# Patient Record
Sex: Female | Born: 1990 | Race: White | Hispanic: No | Marital: Single | Smoking: Never smoker
Health system: Southern US, Community
[De-identification: ages and names within clinical notes are randomized; demographics above are authoritative.]

## PROBLEM LIST (undated history)

## (undated) DIAGNOSIS — G43909 Migraine, unspecified, not intractable, without status migrainosus: Secondary | ICD-10-CM

## (undated) DIAGNOSIS — R569 Unspecified convulsions: Secondary | ICD-10-CM

## (undated) DIAGNOSIS — H543 Unqualified visual loss, both eyes: Secondary | ICD-10-CM

## (undated) DIAGNOSIS — J45909 Unspecified asthma, uncomplicated: Secondary | ICD-10-CM

## (undated) HISTORY — PX: EYE SURGERY: SHX253

---
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
## 2012-01-04 ENCOUNTER — Encounter: Payer: Medicaid Other | Admitting: Rehabilitation

## 2012-01-06 ENCOUNTER — Encounter: Payer: Medicaid Other | Admitting: Rehabilitation

## 2012-01-07 ENCOUNTER — Ambulatory Visit: Payer: Medicaid Other | Attending: Orthopedic Surgery | Admitting: Rehabilitation

## 2012-01-07 DIAGNOSIS — M545 Low back pain, unspecified: Secondary | ICD-10-CM | POA: Insufficient documentation

## 2012-01-07 DIAGNOSIS — M2569 Stiffness of other specified joint, not elsewhere classified: Secondary | ICD-10-CM | POA: Insufficient documentation

## 2012-01-07 DIAGNOSIS — IMO0001 Reserved for inherently not codable concepts without codable children: Secondary | ICD-10-CM | POA: Insufficient documentation

## 2012-01-10 ENCOUNTER — Ambulatory Visit: Payer: Medicaid Other | Admitting: Physical Therapy

## 2012-01-12 ENCOUNTER — Ambulatory Visit: Payer: Medicaid Other | Admitting: Rehabilitation

## 2012-01-17 ENCOUNTER — Ambulatory Visit: Payer: Medicaid Other | Admitting: Rehabilitation

## 2012-01-19 ENCOUNTER — Ambulatory Visit: Payer: Medicaid Other | Admitting: Physical Therapy

## 2012-01-24 ENCOUNTER — Encounter: Payer: Medicaid Other | Admitting: Physical Therapy

## 2012-01-26 ENCOUNTER — Ambulatory Visit: Payer: Medicaid Other | Admitting: Physical Therapy

## 2012-01-28 ENCOUNTER — Encounter: Payer: Medicaid Other | Admitting: Physical Therapy

## 2012-01-31 ENCOUNTER — Encounter: Payer: Medicaid Other | Admitting: Physical Therapy

## 2012-02-02 ENCOUNTER — Ambulatory Visit: Payer: Medicaid Other | Attending: Orthopedic Surgery | Admitting: Physical Therapy

## 2012-02-02 DIAGNOSIS — M2569 Stiffness of other specified joint, not elsewhere classified: Secondary | ICD-10-CM | POA: Insufficient documentation

## 2012-02-02 DIAGNOSIS — M545 Low back pain, unspecified: Secondary | ICD-10-CM | POA: Insufficient documentation

## 2012-02-02 DIAGNOSIS — IMO0001 Reserved for inherently not codable concepts without codable children: Secondary | ICD-10-CM | POA: Insufficient documentation

## 2012-02-07 ENCOUNTER — Ambulatory Visit: Payer: Medicaid Other | Admitting: Physical Therapy

## 2012-02-09 ENCOUNTER — Ambulatory Visit: Payer: Medicaid Other | Admitting: Physical Therapy

## 2012-02-14 ENCOUNTER — Encounter: Payer: Medicaid Other | Admitting: Physical Therapy

## 2012-02-16 ENCOUNTER — Encounter: Payer: Medicaid Other | Admitting: Physical Therapy

## 2014-04-24 ENCOUNTER — Other Ambulatory Visit (HOSPITAL_COMMUNITY)
Admission: RE | Admit: 2014-04-24 | Discharge: 2014-04-24 | Disposition: A | Discharge status: Home / Self Care | Payer: Medicaid Other | Source: Ambulatory Visit | Attending: Family Medicine | Admitting: Family Medicine

## 2014-04-24 ENCOUNTER — Other Ambulatory Visit: Payer: Self-pay | Admitting: Family Medicine

## 2014-04-24 DIAGNOSIS — Z124 Encounter for screening for malignant neoplasm of cervix: Secondary | ICD-10-CM | POA: Diagnosis present

## 2014-04-24 DIAGNOSIS — Z1151 Encounter for screening for human papillomavirus (HPV): Secondary | ICD-10-CM | POA: Diagnosis present

## 2014-04-26 LAB — CYTOLOGY - PAP

## 2015-08-27 ENCOUNTER — Other Ambulatory Visit: Payer: Self-pay | Admitting: Orthopedic Surgery

## 2015-08-27 DIAGNOSIS — M25571 Pain in right ankle and joints of right foot: Secondary | ICD-10-CM

## 2015-09-03 ENCOUNTER — Ambulatory Visit
Admission: RE | Admit: 2015-09-03 | Discharge: 2015-09-03 | Disposition: A | Discharge status: Home / Self Care | Payer: Medicaid Other | Source: Ambulatory Visit | Attending: Orthopedic Surgery | Admitting: Orthopedic Surgery

## 2015-09-03 DIAGNOSIS — M25571 Pain in right ankle and joints of right foot: Secondary | ICD-10-CM

## 2015-09-07 ENCOUNTER — Other Ambulatory Visit: Payer: Medicaid Other

## 2015-09-30 ENCOUNTER — Ambulatory Visit: Payer: Medicaid Other | Attending: Orthopedic Surgery | Admitting: Physical Therapy

## 2015-09-30 DIAGNOSIS — M25571 Pain in right ankle and joints of right foot: Secondary | ICD-10-CM | POA: Diagnosis present

## 2015-09-30 NOTE — Patient Instructions (Signed)
  Gastroc / Heel Cord Stretch - Seated With Towel   Sit on floor, towel around ball of foot. Gently pull foot in toward body, stretching heel cord and calf. Hold for __20_ seconds. Repeat on involved leg. Repeat _3__ times. Do 7___ times per day.  Copyright  VHI. All rights reserved.   Ankle Inversion / Eversion, Sitting   Sit and grasp bottom of one foot. Bend ankle by moving foot inward and outward. Hold each position ___5 seconds. Repeat ___7 times per session. Do _7__ sessions per day.  Copyright  VHI. All rights reserved.   Ankle Sideward Movement (Inversion / Eversion)   Sitting or lying with feet on floor, keep knee still and rock foot onto outer edge. Return to resting position. Now rock foot onto inner edge. Repeat with other foot, then with both feet. Repeat __7__ times. Do __7__ sessions per day.  http://[email or web address].exer.us/405   Copyright  VHI. All rights reserved.     Copyright  VHI. All rights reserved.  HIP: Flexion / KNEE: Extension, Straight Leg Raise   Raise leg, keeping knee straight. Perform slowly. __7_ reps per set, _1 sets per day, __7_ days per week   Copyright  VHI. All rights reserved.  Heel Slide   Bend knee and pull heel toward buttocks. Hold ___5_ seconds. Return. Repeat with other knee. Repeat __7__ times. Do _7___ sessions per day.  http://[email or web address].exer.us/372   Copyright  VHI. All rights reserved.     Raise leg until knee is straight. ___7 reps per set, ___7 sets per day, __7_ days per week  Copyright  VHI. All rights reserved.  Short Arc Arrow ElectronicsQuad   Place a large can or rolled towel under leg. Straighten knee and leg. Hold _5___ seconds. Repeat with other leg. Repeat __7__ times. Do ____7 sessions per day.  http://[email or web address].exer.us/365   Copyright  VHI. All rights reserved.  Quad Set   Slowly tighten muscles on thigh of straight leg while counting out loud to __5__. Repeat with other leg. Repeat __10__ times. Do _7___ sessions per  day.  http://[email or web address].exer.us/361   Copyright  VHI. All rights reserved.

## 2015-10-01 NOTE — Therapy (Signed)
[facility] [address] [place], [place], [address] Phone: [phone]   Fax:  [phone]  Physical Therapy Evaluation/Medicaid 1x visit  Patient Details  Name: Suzanne Fletcher MRN: [id number] Date of Birth: 02-24-1991 Referring Provider: Dr. August Saucer  Encounter Date: 09/30/2015      PT End of Session - 09/30/15 1500    Visit Number 1   Authorization Type Medicaid 1x visit   PT Start Time 0933   PT Stop Time 1017   PT Time Calculation (min) 44 min   Equipment Utilized During Treatment Other (comment)  RW   Activity Tolerance Patient limited by pain      No past medical history on file.  No past surgical history on file.  There were no vitals filed for this visit.  Visit Diagnosis:  Pain in joint, ankle and foot, right - Plan: PT plan of care cert/re-cert      Subjective Assessment - 09/30/15 0937    Subjective In October fell on a handicap ramp.  Inititally able to put weight on it for 2 weeks but as the month went on, had increasing difficulty weightbearing.  Now in walking boot since end of October.    Initially medial but now lateral ankle pain as well.  Hard to have her leg straight in bed.  States the doctor thought it was a high ankle sprain intially.  Bruising still present per mother.  Patient states she has been hopping around the house.  Anterior knee pain as well.     Patient is accompained by: Family member   Pertinent History visual impairment, has a Administrator, Civil Service   Limitations Walking;House hold activities;Standing   Diagnostic tests X-ray and MRI normal    Patient Stated Goals Be able to walk;  wanted to go to a convention in January.     Currently in Pain? Yes   Pain Score 8    Pain Location Ankle   Pain Orientation Right   Pain Descriptors / Indicators Aching   Pain Type Chronic pain   Pain Onset More than a month ago   Pain Frequency Constant   Aggravating Factors  walking, night time   Pain  Relieving Factors cold            [facility] PT Assessment - 09/30/15 0943    Assessment   Medical Diagnosis Right soft tissue ankle injury   Referring Provider Dr. August Saucer   Onset Date/Surgical Date 07/10/15   Hand Dominance Right   Next MD Visit as needed   Prior Therapy back, left foot   Precautions   Precautions None   Restrictions   Weight Bearing Restrictions No   Balance Screen   Has the patient fallen in the past 6 months Yes   How many times? 3   Has the patient had a decrease in activity level because of a fear of falling?  Yes   Is the patient reluctant to leave their home because of a fear of falling?  No   Home Tourist information centre manager residence   Research officer, trade union;Other relatives   Type of Home House   Home Access Stairs to enter   Entrance Stairs-Number of Steps 2   Entrance Stairs-Rails None   Home Layout One level   Prior Function   Level of Independence Needs assistance with homemaking;Needs assistance with gait   Vocation Unemployed   Leisure going out   Observation/Other Assessments   Focus on Therapeutic Outcomes (FOTO)  not done secondary to 1x Medicaid   Posture/Postural Control   Posture/Postural Control --  Patient lies with LE in ER position, knee flexed   Posture Comments LE has a darker skin coloration especially hanging down in dependent position   AROM   AROM Assessment Site Knee;Ankle  Poor toe movement   Right/Left Knee Right;Left   Right Knee Extension 16   Right Knee Flexion 70   Left Knee Extension 0   Left Knee Flexion 140   Right/Left Ankle Right;Left   Right Ankle Dorsiflexion -15   Right Ankle Plantar Flexion 30   Right Ankle Inversion 0   Right Ankle Eversion 0   Left Ankle Dorsiflexion --  WFLs   Strength   Strength Assessment Site Hip;Knee;Ankle   Right/Left Hip Right   Right Hip ABduction 4/5   Right/Left Knee Right   Right Knee Flexion 2/5   Right Knee Extension 2/5   Right/Left Ankle Right    Right Ankle Dorsiflexion 2-/5   Right Ankle Plantar Flexion 2-/5   Right Ankle Inversion 1/5   Right Ankle Eversion 1/5   Palpation   Palpation comment Sensitive to light touch; right colder than left                           PT Education - 09/30/15 1500    Education provided Yes   Education Details ankle and knee ROM, contrast baths, use of RW or assistive device   Person(s) Educated Patient;Parent(s)   Methods Explanation;Demonstration;Handout;Tactile cues;Verbal cues   Comprehension Verbalized understanding             PT Long Term Goals - 10/01/15 0824    PT LONG TERM GOAL #1   Title The patient and mother will be instructed in basic self management strategies for safer ambulation, ROM and desensitization   Time 1   Period Days   Status Achieved               Plan - 09/30/15 1502    Clinical Impression Statement The patient is a 24 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  Inititally able to put weight on it for 2 weeks but as the month went on, had increasing difficulty weightbearing.     Patient states she has been worsening over time and now has anterior knee pain in addition to pain and extreme sensitivity in her entire foot.   She presents today with her service dog, her LE in a walking boot and hopping slowly and painfully.  Her mother is assisting as well.  The patient has sensitivity to touch of right foot and ankle.  Darker red/purple skin discoloration hanging down.  Cold skin temp compared to left.  In supine, she holds her LE in a bent knee, externally rotated position.  She has trace movement in toes, very painful and limited ankle ROM, decreased knee AROM.  She has extreme pain straightening her knee.  It is also of great concern  that she has fallen twice since her iniitial injury in October.  I recommend a RW for safety and a more normal gait pattern.  She was instructed in gait with a RW in the clinic  and she was able to ambulate at a better speed, decreased pain and energy expenditure but she is unable to use her service dog  while using a walker.  We also discussed a single crutch or  cane but the optimal side for benefit would be on the left side and this is the side her service dog has been trained to be on.  Myrtice would greatly benefit from PT to address these numerous deficits however due to changes in the [facility] Policy for rehab as of March 04, 2013, she does not have a qualifying diagnosis that is covered.  The patient is unable to pay out of pocket expenses at this time, therefore she will not be seen for treatment.  We discussed techniques for desensitization, ROM exercises, positioning to encourage knee extension and gait sequence to avoid strain on her knee, hip and back.  [facility] using [facility] handic[facility] ramp into the pool and walking in the water with her mother's assist.  Recommend she get a RW to decrease risk of falls and further injury.     Recommended Other Services Rolling walker for home use   Consulted and Agree with Plan of Care Patient;Family member/caregiver       Due to changes in the [facility] Policy for Rehab as of June 1st , 2014-- this patient does not have a qualifying diagnosis that is covered.  The patient is unable topay out of pocket expenses at this time, there fore will not be seen for treatment.     Problem List There are no active problems to display for this patient.   Vivien Presto 10/01/2015, 8:30 AM  [facility] [address] [place], [place], [address] Phone: [phone]   Fax:  [phone]  Name: Suzanne Fletcher MRN: [id number] Date of Birth: Aug 27, 1991  Lavinia Sharps, PT 10/01/2015 8:31 AM Phone: [phone] Fax: [phone]

## 2015-10-17 ENCOUNTER — Emergency Department (HOSPITAL_COMMUNITY)
Admission: EM | Admit: 2015-10-17 | Discharge: 2015-10-18 | Disposition: A | Discharge status: Home / Self Care | Payer: Medicaid Other | Attending: Emergency Medicine | Admitting: Emergency Medicine

## 2015-10-17 ENCOUNTER — Encounter (HOSPITAL_COMMUNITY): Payer: Self-pay | Admitting: *Deleted

## 2015-10-17 ENCOUNTER — Emergency Department (HOSPITAL_COMMUNITY): Payer: Medicaid Other

## 2015-10-17 DIAGNOSIS — Z880 Allergy status to penicillin: Secondary | ICD-10-CM | POA: Insufficient documentation

## 2015-10-17 DIAGNOSIS — R200 Anesthesia of skin: Secondary | ICD-10-CM | POA: Diagnosis not present

## 2015-10-17 DIAGNOSIS — R52 Pain, unspecified: Secondary | ICD-10-CM

## 2015-10-17 DIAGNOSIS — M25561 Pain in right knee: Secondary | ICD-10-CM | POA: Diagnosis not present

## 2015-10-17 DIAGNOSIS — R202 Paresthesia of skin: Secondary | ICD-10-CM | POA: Insufficient documentation

## 2015-10-17 DIAGNOSIS — Z87828 Personal history of other (healed) physical injury and trauma: Secondary | ICD-10-CM | POA: Diagnosis not present

## 2015-10-17 MED ORDER — IBUPROFEN 400 MG PO TABS
800.0000 mg | ORAL_TABLET | Freq: Once | ORAL | Status: AC
  Administered 2015-10-17: 800 mg via ORAL
  Filled 2015-10-17: qty 2

## 2015-10-17 NOTE — ED Notes (Signed)
The pt has a locked rt knee for 2 days.  No known injury. Recent rt ankle injury.

## 2015-10-17 NOTE — ED Notes (Signed)
Service dog at bedside

## 2015-10-17 NOTE — ED Notes (Signed)
Pt to mri 

## 2015-10-17 NOTE — ED Provider Notes (Signed)
CSN: [id number]     Arrival date & time 10/17/15  1812 History  By signing my name below, I, Doreatha Martin, attest that this documentation has been prepared under the direction and in the presence of Cheri Fowler, PA-C. Electronically Signed: Doreatha Martin, ED Scribe. 10/17/2015. 7:01 PM.      Chief Complaint  Patient presents with  . Knee Pain   The history is provided by the patient. No language interpreter was used.   HPI Comments: Suzanne Fletcher is a 25 y.o. female otherwise healthy who presents to the Emergency Department complaining of decreased ROM of the right knee onset yesterday with associated mild pain and swelling. Pt reports her knee feels locked in a 90 degree flexed position has been unable to fully extend the knee. She reports a recent right ankle injury after a mechanical fall in 07/2015 and notes she also had some mild knee pain with this injury. She states she has been attending physical therapy for her ankle injury. Pt also reports some intermittent paresthesia and numbness to the leg, but reports this is normal since the ankle injury.No Hx of similar symptoms. Pt states she has tried ice and heat with little to no relief.  She denies fever, abdominal pain, nausea, emesis, additional injuries.    History reviewed. No pertinent past medical history. History reviewed. No pertinent past surgical history. No family history on file. Social History  Substance Use Topics  . Smoking status: Never Smoker   . Smokeless tobacco: None  . Alcohol Use: No   OB History    No data available     Review of Systems A complete 10 system review of systems was obtained and all systems are negative except as noted in the HPI and PMH.    Allergies  Penicillins and Sulfa antibiotics  Home Medications   Prior to Admission medications   Not on File   BP 108/71 mmHg  Pulse 98  Temp(Src) 98.4 F (36.9 C) (Oral)  Resp 18  Ht 5\' 6"  (1.676 m)  Wt 165 lb (74.844 kg)  BMI 26.64 kg/m2   SpO2 99%  LMP 09/28/2015 Physical Exam  Constitutional: She is oriented to person, place, and time. She appears well-developed and well-nourished.  Pt lying in bed with knee flexed at 90 degrees.   HENT:  Head: Normocephalic and atraumatic.  Eyes: Conjunctivae and EOM are normal. Pupils are equal, round, and reactive to light.  Neck: Normal range of motion. Neck supple.  Cardiovascular: Normal rate.   2+ DP pulses bilaterally.   Pulmonary/Chest: Effort normal. No respiratory distress.  Abdominal: She exhibits no distension.  Musculoskeletal:       Right knee: She exhibits decreased range of motion. She exhibits no swelling, no laceration and no erythema.       Legs: Right knee mildly TTP along medial and lateral joint line. No appreciable swelling. Unable to actively or passively fully extend knee.  No focal tightness of the popliteal muscle.  Neurological: She is alert and oriented to person, place, and time.  Strength and sensation intact bilaterally throughout the lower extremities.   Skin: Skin is warm and dry.  Psychiatric: She has a normal mood and affect. Her behavior is normal.  Nursing note and vitals reviewed.   ED Course  Procedures (including critical care time) DIAGNOSTIC STUDIES: Oxygen Saturation is 99% on RA, normal by my interpretation.    COORDINATION OF CARE: 6:54 PM Discussed treatment plan with pt at bedside which includes XR,  pain management and pt agreed to plan.   Imaging Review Dg Knee 1-2 Views Right  10/17/2015  CLINICAL DATA:  Acute onset of right knee pain. Knee locked in flexed position. Initial encounter. EXAM: RIGHT KNEE - 1-2 VIEW COMPARISON:  None. FINDINGS: There is no evidence of fracture or dislocation. No knee joint effusion is seen. No definite soft tissue abnormalities are characterized on radiograph. The joint space is difficult to fully assess due to limitations in positioning. IMPRESSION: No evidence of fracture or dislocation. Given the  patient's symptoms, MRI is recommended for further evaluation. Would also assess for focal tightness at the popliteus muscle, which can simulate meniscal injury. Electronically Signed   By: Roanna RaiderJeffery  Chang M.D.   On: 10/17/2015 19:37   I have personally reviewed and evaluated these images as part of my medical decision-making.  MDM   Final diagnoses:  Pain   Filed Vitals:   10/17/15 1850  BP: 108/71  Pulse: 98  Temp: 98.4 F (36.9 C)  Resp: 18     Meds given in ED:  Medications  ibuprofen (ADVIL,MOTRIN) tablet 800 mg (800 mg Oral Given 10/17/15 1908)    New Prescriptions   No medications on file     Patient X-Ray negative for obvious fracture or dislocation. NVI.  No focal tightness of popliteal muscle. MRI was advised for further evaluation. MRI negative for acute abnormality.  Spoke with Dr. Cherly Hensenhang via telephone discussing results.  Formal report will be available in the AM.  No concern for DVT.  Pt advised to follow up with orthopedics.  Conservative therapy recommended and discussed. Patient will be discharged home & is agreeable with above plan. Returns precautions discussed. Pt appears safe for discharge.   I personally performed the services described in this documentation, which was scribed in my presence. The recorded information has been reviewed and is accurate.   Cheri FowlerKayla Deva Ron, PA-C 10/18/15 [phone]  Linwood DibblesJon Knapp, MD 10/18/15 [phone]

## 2015-10-17 NOTE — ED Notes (Signed)
No problems 

## 2015-10-18 NOTE — ED Notes (Signed)
Pt still waiting for the results  Of her mri.  comfortable

## 2015-10-18 NOTE — Discharge Instructions (Signed)

## 2016-08-24 ENCOUNTER — Ambulatory Visit: Payer: Medicaid Other | Attending: Family Medicine | Admitting: Physical Therapy

## 2016-08-24 DIAGNOSIS — G8929 Other chronic pain: Secondary | ICD-10-CM | POA: Diagnosis present

## 2016-08-24 DIAGNOSIS — M25561 Pain in right knee: Secondary | ICD-10-CM | POA: Insufficient documentation

## 2016-08-24 DIAGNOSIS — M6281 Muscle weakness (generalized): Secondary | ICD-10-CM | POA: Insufficient documentation

## 2016-08-24 DIAGNOSIS — R262 Difficulty in walking, not elsewhere classified: Secondary | ICD-10-CM | POA: Insufficient documentation

## 2016-08-24 DIAGNOSIS — R209 Unspecified disturbances of skin sensation: Secondary | ICD-10-CM | POA: Insufficient documentation

## 2016-08-24 DIAGNOSIS — M25571 Pain in right ankle and joints of right foot: Secondary | ICD-10-CM | POA: Diagnosis present

## 2016-08-24 NOTE — Patient Instructions (Signed)
   Copyright  VHI. All rights reserved.  HIP: Flexion / KNEE: Extension, Straight Leg Raise   Raise leg, keeping knee straight. Perform slowly. __10_ reps per set, _2_ sets per day, _7__ days per week   Copyright  VHI. All rights reserved.  Heel Slide   Bend knee and pull heel toward buttocks. Hold __10__ seconds. Return. Repeat with other knee. Repeat _10__ times. Do __2__ sessions per day.  http://[email or web address].exer.us/372   Copyright  VHI. All rights reserved.     Raise leg until knee is straight. _10__ reps per set, __2_ sets per day, __7_ days per week  Copyright  VHI. All rights reserved.  Short Arc Arrow ElectronicsQuad   Place a large can or rolled towel under leg. Straighten knee and leg. Hold _10___ seconds. Repeat with other leg. Repeat _10___ times. Do ___2_ sessions per day.  http://[email or web address].exer.us/365   Copyright  VHI. All rights reserved.  Quad Set   Slowly tighten muscles on thigh of straight leg while counting out loud to _10___. Repeat with other leg. Repeat ___10_ times. Do __2__ sessions per day.  http://[email or web address].exer.us/361   Copyright  VHI. All rights reserved.    Gastroc / Heel Cord Stretch - Seated With Towel    Sit on floor, towel around ball of foot. Gently pull foot in toward body, stretching heel cord and calf. Hold for _30__ seconds. Repeat on involved leg. Repeat _3__ times. Do _2_ times per day.  Copyright  VHI. All rights reserved.  HIP: Hamstrings - Short Sitting    Rest leg on raised surface. Keep knee straight. Lift chest. Hold ___30 seconds. __3_ reps per set, _2__ sets per day, __5_ days per week  Copyright  VHI. All rights reserved.

## 2016-08-25 NOTE — Therapy (Addendum)
[place], [place], [address] Phone: [phone]   Fax:  [phone]  Physical Therapy Evaluation and Discharge  Patient Details  Name: Suzanne Fletcher MRN: [id number] Date of Birth: 06-09-1991 Referring Provider: Mayra Neer, MD   Encounter Date: 08/24/2016      PT End of Session - 08/25/16 1820    Visit Number 1   Number of Visits 4   Date for PT Re-Evaluation 09/21/16   PT Start Time 7948   PT Stop Time 1240   PT Time Calculation (min) 55 min   Activity Tolerance Patient tolerated treatment well   Behavior During Therapy [facility] for tasks assessed/performed      No past medical history on file.  No past surgical history on file.  There were no vitals filed for this visit.       Subjective Assessment - 08/24/16 1150    Subjective Last Oct 2016 was R ankle injury, fell down a handicapped ramp, overcorrected and knee twisted medially.  She dealt with mild knee soreness for some time.  In Jan, knee locked into flexion (dislocated) for 2 weeks.  She has pain in Rt. knee, doesn't extend fully.  Walks with limp.  Ankle instability. Numbness mid shin to ankle.  Toes intermirrnty numb.  Sit to stand.  Walks without device (guide dog) and    Patient is accompained by: Family member   Pertinent History injury in high school torn ankle ligments.     Limitations Lifting;Standing;Walking;House hold activities   How long can you sit comfortably? acetaminophen oral, albuterol, ibuprofen,     How long can you stand comfortably? 10 min begins to have incr pain, has had good days when she can walk 1-2 miles    How long can you walk comfortably? 10 min    Diagnostic tests MRI last yr neg.    Patient Stated Goals Pt wants to be able to walk and prevent twisting of ankle    Currently in Pain? Yes   Pain Score 7    Pain Location Knee  min in ankle    Pain Orientation Right;Lateral   Pain Descriptors / Indicators  Sore   Pain Type Chronic pain   Pain Onset More than a month ago   Pain Frequency Intermittent   Aggravating Factors  walking    Pain Relieving Factors custom orthotics             [facility] PT Assessment - 08/24/16 1204      Assessment   Medical Diagnosis knee pain R   Referring Provider Mayra Neer, MD    Onset Date/Surgical Date --  Oct. 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    Leisure music, movies     Cognition   Overall Cognitive Status Within Functional Limits for tasks assessed     Sensation   Light Touch Impaired by gross assessment  Additional Comments reports  mild degree of numbness mid shin to ankle, extends into toes at times     Posture/Postural Control   Posture/Postural Control Postural limitations   Posture Comments unable to ext knee and bear weight fully      AROM   Right Knee Extension 25   Right Knee Flexion 120   Left Knee Extension 0   Left Knee Flexion 130   Right Ankle Dorsiflexion 4   Right Ankle Plantar Flexion 37   Right Ankle Inversion 30   Right Ankle Eversion 5  PROM to 20 deg with pain      Strength   Right Hip Flexion 3+/5   Left Hip Flexion 4+/5   Right Knee Flexion 3/5   Right Knee Extension 3-/5   Left Knee Flexion 4+/5   Left Knee Extension 5/5   Right Ankle Dorsiflexion 4/5   Right Ankle Inversion 3+/5   Right Ankle Eversion 3/5     Palpation   Patella mobility  hypomobile tight laterally   Palpation comment pain with palpation to posterior lateral aspect of knee, patella faces medially, pain with patellar mobilization, no popping   very mild swelling noted     Special Tests   Plica Tests Mediopatellar Test;Hughston's Plica Test     McMurray Test   Findings Positive   Side Right   Comments painful med and lateral, not reliable      Hughston's Plica Test   Findings Negative   Side Right   Comments pain but no popping      Ambulation/Gait   Ambulation Distance (Feet) 300 Feet   Assistive device None   Gait Pattern Step-to pattern;Decreased stance time - right;Right flexed knee in stance;Antalgic                   [facility] Adult PT Treatment/Exercise - 08/24/16 1204      Knee/Hip Exercises: Supine   Quad Sets Strengthening;Right;1 set   Short [employer] Strengthening;Right;1 set   Heel Slides AAROM;Right;1 set     Moist Heat Therapy   Number Minutes Moist Heat 8 Minutes   Moist Heat Location Knee  lateral follow up with ice at home      Manual Therapy   McConnell pulling patella laterally and fibular head wrap under foot to increase DF       Demo but did not perform: hamstring seated, SLR, LAQ and calf stretch             PT Education - 08/25/16 1819    Education provided Yes   Education Details PT/POC, eval findings, financial assist options , extensive HEP   Person(s) Educated Patient;Parent(s)   Methods Explanation;Demonstration;Handout;Tactile cues;Verbal cues   Comprehension Verbalized understanding;Need further instruction          PT Short Term Goals - 08/25/16 1831      PT SHORT TERM GOAL #1   Title Pt will be given HEP for ankle and knee ROM and strength    Time 1   Period Days   Status Achieved     PT SHORT TERM GOAL #2   Title Pt will be I with use of RICE for pain relief and control of swelling.    Time 1   Period Days   Status Achieved     PT SHORT TERM GOAL #3   Title More  goals to be set if patient returns with [facility] coverage.  Plan - 08/25/16 1821    Clinical Impression Statement Patient presents with complications from a severe L ankle injury which led to L knee injury and instability.  Eval is of high complexity due to comorbidities and unstable nature of her knee.  She walks with a significant limp, is unable to full flex or extend her knee and has developed Rt. LE weakness.  Sensory changes may indicate peroneal nerve involvement.  Current level of pain made special tests unreliable. Unfortunately she does not have MCR at this time however, her mother is working on applying and she may be able to attend more than 1 visit that is currently covered by MCD.     Rehab Potential Good   Clinical Impairments Affecting Rehab Potential low vision    PT Frequency 1x / week   PT Duration 4 weeks  May see for more visits if [facility] approved.    PT Treatment/Interventions ADLs/Self Care Home Management;Moist Heat;Therapeutic exercise;Ultrasound;Balance training;Manual techniques;Taping;Cryotherapy;Gait training;Functional mobility training;Iontophoresis 38m/ml Dexamethasone;Patient/family education;Passive range of motion;Neuromuscular re-education   PT Next Visit Plan IF patient returns, check HEP, advance ROM and strength as tol, gait    PT Home Exercise Plan level 1-2 knee and ankle T band   Consulted and Agree with Plan of Care Patient;Family member/caregiver   Family Member Consulted mother      Patient will benefit from skilled therapeutic intervention in order to improve the following deficits and impairments:  Abnormal gait, Decreased range of motion, Difficulty walking, Decreased activity tolerance, Pain, Impaired flexibility, Hypomobility, Decreased mobility, Decreased strength, Increased edema, Impaired sensation, Postural dysfunction  Visit Diagnosis: Pain in joint, ankle and foot, right  Chronic pain of right knee  Difficulty in  walking, not elsewhere classified  Muscle weakness (generalized)  Unspecified disturbances of skin sensation     Problem List There are no active problems to display for this patient.   Gayanne Prescott 08/25/2016, 6:56 PM  [place] [address]Phone: [phone]  Fax:  [phone] Name: LWILHELMENIA ADDISMRN: [id number] of Birth: 507-28-1992 JRaeford Razor PT 08/25/16 6:56 PM Phone: [phone]: [phone]  PHYSICAL THERAPY DISCHARGE SUMMARY  Visits from Start of Care: 1  Current functional level related to goals / functional outcomes: unknown   Remaining deficits: unknown   Education / Equipment: HEP, insurance  Plan: Patient agrees to discharge.  Patient goals were not met. Patient is being discharged due to not returning since the last visit.  ?????  Did not get Disability coverage.  Mother is working on it and will call back in the future for PT if needed.   JRaeford Razor PT 10/11/16 11:13 AM Phone: [phone]: [phone]

## 2016-09-06 ENCOUNTER — Ambulatory Visit: Payer: Medicaid Other | Admitting: Physical Therapy

## 2016-09-20 ENCOUNTER — Ambulatory Visit: Payer: Medicaid Other | Attending: Family Medicine | Admitting: Physical Therapy

## 2016-10-28 ENCOUNTER — Ambulatory Visit (INDEPENDENT_AMBULATORY_CARE_PROVIDER_SITE_OTHER): Payer: Medicaid Other | Admitting: Orthopedic Surgery

## 2016-10-28 ENCOUNTER — Encounter (INDEPENDENT_AMBULATORY_CARE_PROVIDER_SITE_OTHER): Payer: Self-pay | Admitting: Orthopedic Surgery

## 2016-10-28 DIAGNOSIS — G8929 Other chronic pain: Secondary | ICD-10-CM | POA: Diagnosis not present

## 2016-10-28 DIAGNOSIS — M25561 Pain in right knee: Secondary | ICD-10-CM | POA: Diagnosis not present

## 2016-10-28 NOTE — Progress Notes (Signed)
Office Visit Note   Patient: Suzanne Fletcher           Date of Birth: 03/16/1991           MRN: [id number] Visit Date: 10/28/2016 Requested by: Lupita RaiderKimberlee Shaw, MD [address] [place], [place] [address] PCP: Lupita RaiderSHAW,KIMBERLEE, MD  Subjective: Chief Complaint  Patient presents with  . Right Knee - Pain    HPI Suzanne AbedLindsay is a 26 year old female with right knee pain.  She had a right knee injection 06/24/2016 which she states "helped a little".  The past one half weeks cannot straighten the knee.  This occurred after she was sitting and eating dinner.  She's been limping and walking on her tiptoes.  She states that it "popped out again" she recently saw physical therapist in December.  She reports swelling popping she emulates with a rolling seated walker.  She has a therapy dog.  Cold weather increases the pain.  She been taking ibuprofen.  She describes localized pain to the lateral joint line.              Review of Systems All systems reviewed are negative as they relate to the chief complaint within the history of present illness.  Patient denies  fevers or chills.    Assessment & Plan: Visit Diagnoses:  1. Chronic pain of right knee     Plan: Impression is right knee pain with similar pattern to last year where MRI scan was normal the patient could not straighten her leg we took her to surgery in the leg was easily straightened.  I do not think that there is a structural problem in the knee except for the remote possibility that the patient has a Wrisberg variant lateral meniscus which could be flipping into the joint and causing it to lock.  Plan is for MRI scan to see if that is the case currently.  She will not straighten the knee beyond 30 of flexion.  There is no effusion in the knee but there is lateral joint line tenderness.  We'll see her back after that study to see if in fact she has some mechanical or structural block to straightening the leg.  Follow-Up  Instructions: Return for after MRI.   Orders:  No orders of the defined types were placed in this encounter.  No orders of the defined types were placed in this encounter.     Procedures: No procedures performed   Clinical Data: No additional findings.  Objective: Vital Signs: There were no vitals taken for this visit.  Physical Exam   Constitutional: Patient appears well-developed HEENT:  Head: Normocephalic Eyes:EOM are normal Neck: Normal range of motion Cardiovascular: Normal rate Pulmonary/chest: Effort normal Neurologic: Patient is alert Skin: Skin is warm Psychiatric: Patient has normal mood and affect    Ortho Exam orthopedic exam demonstrates flexion from 30 to past 90 but she will not straighten the leg past 30 of flexion.  There is no effusion.  Lateral joint line tenderness is present.  Pedal pulses palpable.  Patella is well aligned without apprehension.  No medial patellar tenderness.  No groin pain with internal/external rotation of the leg.  Specialty Comments:  No specialty comments available.  Imaging: No results found.   PMFS History: There are no active problems to display for this patient.  No past medical history on file.  No family history on file.  No past surgical history on file. Social History   Occupational History  .  Not on file.   Social History Main Topics  . Smoking status: Never Smoker  . Smokeless tobacco: Never Used  . Alcohol use No  . Drug use: Unknown  . Sexual activity: Not on file

## 2016-10-29 ENCOUNTER — Encounter (INDEPENDENT_AMBULATORY_CARE_PROVIDER_SITE_OTHER): Payer: Self-pay | Admitting: Orthopedic Surgery

## 2016-10-29 ENCOUNTER — Ambulatory Visit
Admission: RE | Admit: 2016-10-29 | Discharge: 2016-10-29 | Disposition: A | Discharge status: Home / Self Care | Payer: Medicaid Other | Source: Ambulatory Visit | Attending: Orthopedic Surgery | Admitting: Orthopedic Surgery

## 2016-10-29 DIAGNOSIS — M25561 Pain in right knee: Principal | ICD-10-CM

## 2016-10-29 DIAGNOSIS — G8929 Other chronic pain: Secondary | ICD-10-CM

## 2016-11-02 ENCOUNTER — Encounter (INDEPENDENT_AMBULATORY_CARE_PROVIDER_SITE_OTHER): Payer: Self-pay | Admitting: Orthopedic Surgery

## 2016-11-02 ENCOUNTER — Ambulatory Visit (INDEPENDENT_AMBULATORY_CARE_PROVIDER_SITE_OTHER): Payer: Medicaid Other | Admitting: Orthopedic Surgery

## 2016-11-02 VITALS — Ht 66.0 in | Wt 165.0 lb

## 2016-11-02 DIAGNOSIS — G8929 Other chronic pain: Secondary | ICD-10-CM | POA: Diagnosis not present

## 2016-11-02 DIAGNOSIS — M25561 Pain in right knee: Secondary | ICD-10-CM | POA: Diagnosis not present

## 2016-11-02 MED ORDER — CYCLOBENZAPRINE HCL 10 MG PO TABS: 10.0000 mg | ORAL_TABLET | Freq: Three times a day (TID) | ORAL | 0 refills | Status: DC | PRN

## 2016-11-02 MED ORDER — BUPIVACAINE HCL 0.25 % IJ SOLN
4.0000 mL | INTRAMUSCULAR | Status: AC | PRN
  Administered 2016-11-02: 4 mL via INTRA_ARTICULAR

## 2016-11-02 MED ORDER — LIDOCAINE HCL 1 % IJ SOLN
5.0000 mL | INTRAMUSCULAR | Status: AC | PRN
  Administered 2016-11-02: 5 mL

## 2016-11-02 MED ORDER — METHYLPREDNISOLONE ACETATE 40 MG/ML IJ SUSP
40.0000 mg | INTRAMUSCULAR | Status: AC | PRN
  Administered 2016-11-02: 40 mg via INTRA_ARTICULAR

## 2016-11-02 NOTE — Progress Notes (Signed)
Office Visit Note   Patient: Suzanne Fletcher           Date of Birth: 12/26/1990           MRN: [id number] Visit Date: 11/02/2016 Requested by: Lupita RaiderKimberlee Shaw, MD [address] [place], [place] [address] PCP: Lupita RaiderSHAW,KIMBERLEE, MD  Subjective: Chief Complaint  Patient presents with  . Right Knee - Follow-up    Follow up MRI scan right knee    HPI Suzanne Fletcher is a 26 year old female with right knee pain.  Since of [place] she's had an MRI scan.  As with last year the scan was normal.  She still walks with a flexed knee gait.  She seen her neurologist next month.  She had an injection in September which helped.  Doesn't really want to go therapy because her insurance won't really cover many therapy visits.  She's been taking Motrin and Tylenol.              Review of Systems All systems reviewed are negative as they relate to the chief complaint within the history of present illness.  Patient denies  fevers or chills.    Assessment & Plan: Visit Diagnoses: No diagnosis found.  Plan: Impression is right knee pain with flexed knee gait with no real structural issue on MRI scanning.  Nothing is mechanically blocking extension.  There is no groin pain or evidence of referred pain from the back.  She is reporting a little bit of numbness in the right foot below calf level which I do not see as a surgical problem.  That could be potentially worked up by her neurologist.  In regards to the knee last year we took her to surgery and the knee straightened out completely with the patient under anesthesia.  I anticipate the same thing would happen with this based on the normal MRI scan.  I would favor an injection which we did today as well as self directed treatment to work on achieving full extension.  No operative intervention required at this time but I did counsel the patient as to the necessity of getting the leg straight.  I am going to have a different muscle relaxer given 1 she's been  taking to try to see if I can help relax the muscles.  I'll see her back as needed  Follow-Up Instructions: Return if symptoms worsen or fail to improve.   Orders:  No orders of the defined types were placed in this encounter.  No orders of the defined types were placed in this encounter.     Procedures: Large Joint Inj Date/Time: 11/02/2016 10:09 AM Performed by: Cammy CopaEAN, SCOTT Thatcher Doberstein Authorized by: Cammy CopaEAN, SCOTT Waymond Meador   Consent Given by:  Patient Site marked: the procedure site was marked   Timeout: prior to procedure the correct patient, procedure, and site was verified   Indications:  Pain, joint swelling and diagnostic evaluation Location:  Knee Site:  R knee Prep: patient was prepped and draped in usual sterile fashion   Needle Size:  18 G Needle Length:  1.5 inches Approach:  Superolateral Ultrasound Guidance: No   Fluoroscopic Guidance: No   Arthrogram: No   Medications:  5 mL lidocaine 1 %; 4 mL bupivacaine 0.25 %; 40 mg methylPREDNISolone acetate 40 MG/ML Patient tolerance:  Patient tolerated the procedure well with no immediate complications     Clinical Data: No additional findings.  Objective: Vital Signs: Ht 5\' 6"  (1.676 m)   Wt 165 lb (  74.8 kg)   BMI 26.63 kg/m   Physical Exam   Constitutional: Patient appears well-developed HEENT:  Head: Normocephalic Eyes:EOM are normal Neck: Normal range of motion Cardiovascular: Normal rate Pulmonary/chest: Effort normal Neurologic: Patient is alert Skin: Skin is warm Psychiatric: Patient has normal mood and affect    Ortho Exam examination the right knee demonstrates flexed knee gait she is reluctant to straighten the right knee much more beyond 25 of flexion.  She can bend it pretty well.  There is no joint line tenderness no groin pain with internal/external rotation of the leg no nerve retention signs she has good ankle dorsiflexion plantar flexion strength.  Specialty Comments:  No specialty  comments available.  Imaging: No results found.   PMFS History: There are no active problems to display for this patient.  History reviewed. No pertinent past medical history.  History reviewed. No pertinent family history.  History reviewed. No pertinent surgical history. Social History   Occupational History  . Not on file.   Social History Main Topics  . Smoking status: Never Smoker  . Smokeless tobacco: Never Used  . Alcohol use No  . Drug use: Unknown  . Sexual activity: Not on file

## 2016-11-03 ENCOUNTER — Telehealth (INDEPENDENT_AMBULATORY_CARE_PROVIDER_SITE_OTHER): Payer: Self-pay | Admitting: Orthopedic Surgery

## 2016-11-03 ENCOUNTER — Other Ambulatory Visit (INDEPENDENT_AMBULATORY_CARE_PROVIDER_SITE_OTHER): Payer: Self-pay | Admitting: Radiology

## 2016-11-03 NOTE — Telephone Encounter (Signed)
Patient state her Rx for Flexeril still is not at her pharmacy. [facility]

## 2016-11-03 NOTE — Telephone Encounter (Signed)
[facility] and they do have this ready, s/w Joselyn Glassmanyler, he will call pt and advise.

## 2018-11-01 ENCOUNTER — Other Ambulatory Visit (HOSPITAL_COMMUNITY)
Admission: RE | Admit: 2018-11-01 | Discharge: 2018-11-01 | Disposition: A | Discharge status: Home / Self Care | Payer: Medicaid Other | Source: Ambulatory Visit | Attending: Family Medicine | Admitting: Family Medicine

## 2018-11-01 ENCOUNTER — Other Ambulatory Visit: Payer: Self-pay | Admitting: Family Medicine

## 2018-11-01 DIAGNOSIS — Z124 Encounter for screening for malignant neoplasm of cervix: Secondary | ICD-10-CM | POA: Diagnosis present

## 2018-11-02 LAB — CYTOLOGY - PAP
Diagnosis: NEGATIVE
HPV: NOT DETECTED

## 2019-03-27 ENCOUNTER — Emergency Department (HOSPITAL_BASED_OUTPATIENT_CLINIC_OR_DEPARTMENT_OTHER)
Admission: EM | Admit: 2019-03-27 | Discharge: 2019-03-27 | Disposition: A | Discharge status: Home / Self Care | Payer: Medicaid Other | Attending: Emergency Medicine | Admitting: Emergency Medicine

## 2019-03-27 ENCOUNTER — Encounter (HOSPITAL_BASED_OUTPATIENT_CLINIC_OR_DEPARTMENT_OTHER): Payer: Self-pay | Admitting: Emergency Medicine

## 2019-03-27 ENCOUNTER — Emergency Department (HOSPITAL_BASED_OUTPATIENT_CLINIC_OR_DEPARTMENT_OTHER): Payer: Medicaid Other

## 2019-03-27 ENCOUNTER — Other Ambulatory Visit: Payer: Self-pay

## 2019-03-27 DIAGNOSIS — R0602 Shortness of breath: Secondary | ICD-10-CM | POA: Insufficient documentation

## 2019-03-27 DIAGNOSIS — J4521 Mild intermittent asthma with (acute) exacerbation: Secondary | ICD-10-CM | POA: Insufficient documentation

## 2019-03-27 DIAGNOSIS — Z20828 Contact with and (suspected) exposure to other viral communicable diseases: Secondary | ICD-10-CM | POA: Insufficient documentation

## 2019-03-27 DIAGNOSIS — E876 Hypokalemia: Secondary | ICD-10-CM | POA: Diagnosis not present

## 2019-03-27 DIAGNOSIS — R071 Chest pain on breathing: Secondary | ICD-10-CM | POA: Diagnosis not present

## 2019-03-27 HISTORY — DX: Migraine, unspecified, not intractable, without status migrainosus: G43.909

## 2019-03-27 HISTORY — DX: Unspecified convulsions: R56.9

## 2019-03-27 HISTORY — DX: Unqualified visual loss, both eyes: H54.3

## 2019-03-27 HISTORY — DX: Unspecified asthma, uncomplicated: J45.909

## 2019-03-27 LAB — CBC WITH DIFFERENTIAL/PLATELET
Abs Immature Granulocytes: 0.01 10*3/uL (ref 0.00–0.07)
Basophils Absolute: 0 10*3/uL (ref 0.0–0.1)
Basophils Relative: 1 %
Eosinophils Absolute: 0 10*3/uL (ref 0.0–0.5)
Eosinophils Relative: 1 %
HCT: 33 % — ABNORMAL LOW (ref 36.0–46.0)
Hemoglobin: 10.4 g/dL — ABNORMAL LOW (ref 12.0–15.0)
Immature Granulocytes: 0 %
Lymphocytes Relative: 44 %
Lymphs Abs: 2.8 10*3/uL (ref 0.7–4.0)
MCH: 26.9 pg (ref 26.0–34.0)
MCHC: 31.5 g/dL (ref 30.0–36.0)
MCV: 85.3 fL (ref 80.0–100.0)
Monocytes Absolute: 0.4 10*3/uL (ref 0.1–1.0)
Monocytes Relative: 6 %
Neutro Abs: 3.2 10*3/uL (ref 1.7–7.7)
Neutrophils Relative %: 48 %
Platelets: 212 10*3/uL (ref 150–400)
RBC: 3.87 MIL/uL (ref 3.87–5.11)
RDW: 13.4 % (ref 11.5–15.5)
WBC: 6.5 10*3/uL (ref 4.0–10.5)
nRBC: 0 % (ref 0.0–0.2)

## 2019-03-27 LAB — URINALYSIS, ROUTINE W REFLEX MICROSCOPIC
Glucose, UA: NEGATIVE mg/dL
Hgb urine dipstick: NEGATIVE
Ketones, ur: NEGATIVE mg/dL
Leukocytes,Ua: NEGATIVE
Nitrite: NEGATIVE
Protein, ur: NEGATIVE mg/dL
Specific Gravity, Urine: 1.02 (ref 1.005–1.030)
pH: 6 (ref 5.0–8.0)

## 2019-03-27 LAB — COMPREHENSIVE METABOLIC PANEL
ALT: 16 U/L (ref 0–44)
AST: 19 U/L (ref 15–41)
Albumin: 3.9 g/dL (ref 3.5–5.0)
Alkaline Phosphatase: 45 U/L (ref 38–126)
Anion gap: 10 (ref 5–15)
BUN: 7 mg/dL (ref 6–20)
CO2: 21 mmol/L — ABNORMAL LOW (ref 22–32)
Calcium: 8.7 mg/dL — ABNORMAL LOW (ref 8.9–10.3)
Chloride: 104 mmol/L (ref 98–111)
Creatinine, Ser: 0.77 mg/dL (ref 0.44–1.00)
GFR calc Af Amer: >60 mL/min (ref >60)
GFR calc non Af Amer: >60 mL/min (ref >60)
Glucose, Bld: 112 mg/dL — ABNORMAL HIGH (ref 70–99)
Potassium: 3.2 mmol/L — ABNORMAL LOW (ref 3.5–5.1)
Sodium: 135 mmol/L (ref 135–145)
Total Bilirubin: 0.6 mg/dL (ref 0.3–1.2)
Total Protein: 7.3 g/dL (ref 6.5–8.1)

## 2019-03-27 LAB — TSH: TSH: 0.829 u[IU]/mL (ref 0.350–4.500)

## 2019-03-27 LAB — BRAIN NATRIURETIC PEPTIDE: B Natriuretic Peptide: 8.6 pg/mL (ref 0.0–100.0)

## 2019-03-27 LAB — SARS CORONAVIRUS 2 AG (30 MIN TAT): SARS Coronavirus 2 Ag: NEGATIVE

## 2019-03-27 LAB — PREGNANCY, URINE: Preg Test, Ur: NEGATIVE

## 2019-03-27 LAB — GROUP A STREP BY PCR: Group A Strep by PCR: NOT DETECTED

## 2019-03-27 LAB — D-DIMER, QUANTITATIVE: D-Dimer, Quant: 0.33 ug/mL-FEU (ref 0.00–0.50)

## 2019-03-27 MED ORDER — SODIUM CHLORIDE 0.9 % IV BOLUS
1000.0000 mL | Freq: Once | INTRAVENOUS | Status: AC
  Administered 2019-03-27: 1000 mL via INTRAVENOUS

## 2019-03-27 MED ORDER — SODIUM CHLORIDE 0.9 % IV BOLUS
500.0000 mL | Freq: Once | INTRAVENOUS | Status: AC
  Administered 2019-03-27: 500 mL via INTRAVENOUS

## 2019-03-27 MED ORDER — POTASSIUM CHLORIDE CRYS ER 20 MEQ PO TBCR
40.0000 meq | EXTENDED_RELEASE_TABLET | Freq: Once | ORAL | Status: AC
  Administered 2019-03-27: 40 meq via ORAL
  Filled 2019-03-27: qty 2

## 2019-03-27 MED ORDER — ACETAMINOPHEN 500 MG PO TABS
1000.0000 mg | ORAL_TABLET | Freq: Once | ORAL | Status: AC
  Administered 2019-03-27: 1000 mg via ORAL
  Filled 2019-03-27: qty 2

## 2019-03-27 MED ORDER — IBUPROFEN 400 MG PO TABS
400.0000 mg | ORAL_TABLET | Freq: Once | ORAL | Status: AC
  Administered 2019-03-27: 400 mg via ORAL
  Filled 2019-03-27: qty 1

## 2019-03-27 MED ORDER — ALBUTEROL SULFATE HFA 108 (90 BASE) MCG/ACT IN AERS
INHALATION_SPRAY | RESPIRATORY_TRACT | Status: AC
  Filled 2019-03-27: qty 6.7

## 2019-03-27 MED ORDER — IOHEXOL 350 MG/ML SOLN
100.0000 mL | Freq: Once | INTRAVENOUS | Status: AC | PRN
  Administered 2019-03-27: 100 mL via INTRAVENOUS

## 2019-03-27 MED ORDER — ALBUTEROL SULFATE HFA 108 (90 BASE) MCG/ACT IN AERS: 4.0000 | INHALATION_SPRAY | Freq: Once | RESPIRATORY_TRACT | Status: DC

## 2019-03-27 MED ORDER — DEXAMETHASONE SODIUM PHOSPHATE 10 MG/ML IJ SOLN
10.0000 mg | Freq: Once | INTRAMUSCULAR | Status: AC
  Administered 2019-03-27: 10 mg via INTRAMUSCULAR
  Filled 2019-03-27: qty 1

## 2019-03-27 MED ORDER — ALBUTEROL SULFATE HFA 108 (90 BASE) MCG/ACT IN AERS
4.0000 | INHALATION_SPRAY | Freq: Once | RESPIRATORY_TRACT | Status: AC
  Administered 2019-03-27: 08:00:00 4 via RESPIRATORY_TRACT
  Filled 2019-03-27: qty 6.7

## 2019-03-27 NOTE — ED Provider Notes (Signed)
[facility] EMERGENCY DEPARTMENT Provider Note   CSN: [id number] Arrival date & time: 03/27/19  [id number]    History   Chief Complaint Chief Complaint  Patient presents with  . Shortness of Breath    HPI Suzanne Fletcher is a 28 y.o. female.     Patient with history of blind in both eyes, asthma typically controlled with inhalers, seizures presents with primarily shortness of breath since approximately 3 this morning.  Patient does have central sharp chest pain as well.  Patient feels this is more severe presentation of her asthma.  Patient denies blood clot risk factors.  Patient denies fevers, sick contacts, travel or exposure to CO VID.  Patient has no heart problems or diabetes.  Patient has mild sore throat.     Past Medical History:  Diagnosis Date  . Asthma   . Blind in both eyes   . Migraine   . Seizures (HCC)     There are no active problems to display for this patient.   Past Surgical History:  Procedure Laterality Date  . EYE SURGERY       OB History   No obstetric history on file.      Home Medications    Prior to Admission medications   Medication Sig Start Date End Date Taking? Authorizing Provider  albuterol (PROVENTIL HFA;VENTOLIN HFA) 108 (90 Base) MCG/ACT inhaler Inhale into the lungs.    [provider]  cyclobenzaprine (FLEXERIL) 10 MG tablet Take 1 tablet (10 mg total) by mouth 3 (three) times daily as needed for muscle spasms. 11/02/16   Cammy Copaean, Gregory Scott, MD  ibuprofen (ADVIL,MOTRIN) 200 MG tablet Take by mouth.    [provider]  levETIRAcetam (KEPPRA) 750 MG tablet Take by mouth. 06/08/16   [provider]  levocetirizine (XYZAL) 5 MG tablet Take by mouth.    [provider]  metaxalone (SKELAXIN) 800 MG tablet 1 pill every 6 hours as needed 07/02/10   [provider]  montelukast (SINGULAIR) 10 MG tablet Take by mouth.    [provider]  Pimozide 1 MG TABS Take by mouth.  10/23/15   [provider]  traMADol (ULTRAM) 50 MG tablet Take by mouth.    [provider]  verapamil (VERELAN PM) 120 MG 24 hr capsule Take 120 mg by mouth at bedtime.    [provider]    Family History History reviewed. No pertinent family history.  Social History Social History   Tobacco Use  . Smoking status: Never Smoker  . Smokeless tobacco: Never Used  Substance Use Topics  . Alcohol use: No  . Drug use: Not on file     Allergies   Ampicillin, Cefadroxil, Cefixime, Cephalosporins, Penicillins, and Sulfa antibiotics   Review of Systems Review of Systems  Constitutional: Negative for chills and fever.  HENT: Positive for sore throat. Negative for congestion.   Eyes: Negative for visual disturbance.  Respiratory: Positive for cough and shortness of breath.   Cardiovascular: Positive for chest pain. Negative for leg swelling.  Gastrointestinal: Positive for abdominal pain. Negative for vomiting.  Genitourinary: Negative for dysuria and flank pain.  Musculoskeletal: Negative for back pain, neck pain and neck stiffness.  Skin: Negative for rash.  Neurological: Negative for light-headedness and headaches.     Physical Exam Updated Vital Signs BP 117/89   Pulse (!) 107   Temp 98.1 F (36.7 C) (Oral)   Resp 12   Ht 5\' 5"  (1.651 m)  Wt 64.4 kg   LMP 02/02/2019 (Approximate)   SpO2 100%   BMI 23.63 kg/m   Physical Exam Vitals signs and nursing note reviewed.  Constitutional:      Appearance: She is well-developed.  HENT:     Head: Normocephalic and atraumatic.  Eyes:     General:        Right eye: No discharge.        Left eye: No discharge.     Conjunctiva/sclera: Conjunctivae normal.  Neck:     Musculoskeletal: Normal range of motion and neck supple.     Trachea: No tracheal deviation.  Cardiovascular:     Rate and Rhythm: Regular rhythm. Tachycardia present.  Pulmonary:     Effort: Pulmonary effort is normal.      Breath sounds: Examination of the right-lower field reveals decreased breath sounds. Examination of the left-lower field reveals decreased breath sounds. Decreased breath sounds present.  Abdominal:     General: There is no distension.     Palpations: Abdomen is soft.     Tenderness: There is no abdominal tenderness. There is no guarding.  Musculoskeletal:     Right lower leg: She exhibits no tenderness. No edema.     Left lower leg: She exhibits no tenderness. No edema.  Skin:    General: Skin is warm.     Findings: No rash.  Neurological:     Mental Status: She is alert and oriented to person, place, and time.      ED Treatments / Results  Labs (all labs ordered are listed, but only abnormal results are displayed) Labs Reviewed  CBC WITH DIFFERENTIAL/PLATELET - Abnormal; Notable for the following components:      Result Value   Hemoglobin 10.4 (*)    HCT 33.0 (*)    All other components within normal limits  COMPREHENSIVE METABOLIC PANEL - Abnormal; Notable for the following components:   Potassium 3.2 (*)    CO2 21 (*)    Glucose, Bld 112 (*)    Calcium 8.7 (*)    All other components within normal limits  URINALYSIS, ROUTINE W REFLEX MICROSCOPIC - Abnormal; Notable for the following components:   Bilirubin Urine SMALL (*)    All other components within normal limits  GROUP A STREP BY PCR  SARS CORONAVIRUS 2 (HOSP ORDER, PERFORMED IN [facility] VIA ABBOTT ID)  D-DIMER, QUANTITATIVE (NOT AT [facility])  BRAIN NATRIURETIC PEPTIDE  PREGNANCY, URINE  TSH    EKG EKG Interpretation  Date/Time:  Tuesday March 27 2019 07:52:54 EDT Ventricular Rate:  101 PR Interval:    QRS Duration: 109 QT Interval:  341 QTC Calculation: 442 R Axis:   61 Text Interpretation:  Sinus tachycardia RSR' in V1 or V2, right VCD or RVH Confirmed by Blane OharaZavitz, Mallory Schaad [phone]) on 03/27/2019 7:55:32 AM   Radiology Dg Neck Soft Tissue  Result Date: 03/27/2019 CLINICAL DATA:  Sore throat and  shortness of breath EXAM: NECK SOFT TISSUES - 1+ VIEW COMPARISON:  None. FINDINGS: There is no evidence of retropharyngeal soft tissue swelling or epiglottic enlargement (epiglottic is largely apposed to the tongue base). The cervical airway is unremarkable and no radio-opaque foreign body identified. IMPRESSION: Negative. Electronically Signed   By: Marnee SpringJonathon  Watts M.D.   On: 03/27/2019 07:39   Dg Chest 2 View  Result Date: 03/27/2019 CLINICAL DATA:  28 year old female with a history of shortness of breath EXAM: CHEST - 2 VIEW COMPARISON:  03/29/2004 FINDINGS: Cardiomediastinal silhouette within normal limits.  No evidence of central vascular congestion or interlobular septal thickening. No pneumothorax or pleural effusion. No confluent airspace disease. No displaced fracture. IMPRESSION: Negative for acute cardiopulmonary disease Electronically Signed   By: Corrie Mckusick D.O.   On: 03/27/2019 07:40   Ct Angio Chest Pe W And/or Wo Contrast  Result Date: 03/27/2019 CLINICAL DATA:  Shortness of breath EXAM: CT ANGIOGRAPHY CHEST WITH CONTRAST TECHNIQUE: Multidetector CT imaging of the chest was performed using the standard protocol during bolus administration of intravenous contrast. Multiplanar CT image reconstructions and MIPs were obtained to evaluate the vascular anatomy. CONTRAST:  136mL OMNIPAQUE IOHEXOL 350 MG/ML SOLN COMPARISON:  Chest radiograph March 27, 2019 FINDINGS: Cardiovascular: There is no demonstrable pulmonary embolus. There is no thoracic aortic aneurysm or dissection. Visualized great vessels appear normal. Note that the right innominate and left common carotid arteries arise as a common trunk, an anatomic variant. There is no pericardial effusion or pericardial thickening. Mediastinum/Nodes: Visualized thyroid appears unremarkable. There is no demonstrable thoracic adenopathy. There is no esophageal lesion. Lungs/Pleura: Lungs are clear. No pleural effusion or pleural thickening evident.  Upper Abdomen: There are laminated gallstones within the gallbladder. Visualized upper abdominal structures otherwise appear unremarkable. Musculoskeletal: There are no blastic or lytic bone lesions. No evident chest wall lesions. Review of the MIP images confirms the above findings. IMPRESSION: 1. No demonstrable pulmonary embolus. No thoracic aortic aneurysm or dissection. 2.  Lungs clear. 3.  No evident thoracic adenopathy. 4.  Cholelithiasis. Electronically Signed   By: Lowella Grip III M.D.   On: 03/27/2019 13:23    Procedures Procedures (including critical care time)  Medications Ordered in ED Medications  dexamethasone (DECADRON) injection 10 mg (10 mg Intramuscular Given 03/27/19 0656)  sodium chloride 0.9 % bolus 500 mL (0 mLs Intravenous Stopped 03/27/19 0935)  albuterol (VENTOLIN HFA) 108 (90 Base) MCG/ACT inhaler 4 puff (4 puffs Inhalation Given 03/27/19 0808)  acetaminophen (TYLENOL) tablet 1,000 mg (1,000 mg Oral Given 03/27/19 0814)  potassium chloride SA (K-DUR) CR tablet 40 mEq (40 mEq Oral Given 03/27/19 0920)  sodium chloride 0.9 % bolus 500 mL (0 mLs Intravenous Stopped 03/27/19 1100)  ibuprofen (ADVIL) tablet 400 mg (400 mg Oral Given 03/27/19 1220)  sodium chloride 0.9 % bolus 1,000 mL (1,000 mLs Intravenous New Bag/Given 03/27/19 1320)  iohexol (OMNIPAQUE) 350 MG/ML injection 100 mL (100 mLs Intravenous Contrast Given 03/27/19 1308)     Initial Impression / Assessment and Plan / ED Course  I have reviewed the triage vital signs and the nursing notes.  Pertinent labs & imaging results that were available during my care of the patient were reviewed by me and considered in my medical decision making (see chart for details).       Patient with history of asthma history presents with worsening shortness of breath since this morning.  Patient has decreased air movement bilateral.  Discussed differential diagnosis including asthma, viral syndrome, COVID, pulmonary embolism,  other.  Plan for chest x-ray and lateral neck x-ray which were reviewed no acute findings.  Strep test negative.  Urinalysis pending.  Plan for infectious and blood clot blood work up.  Pt very low risk CVA and with sharp chest pain I do not feel high sensitivity troponin will be useful at this time.  D dimer neg, pt low risl. Fluids given.  Decadron and albuterol ordered for possible asthma component.  Small fluid bolus to see if it also helps with the tachycardia. PO potassium given.    Patient's  COVID test negative.  Patient improved on reassessment with observation heart rate low 100s.  Patient is low risk blood clot no further work-up indicated.  Patient is extremely low risk cardiac.  Patient observed in the ER and heart rate improved from 1 30-1 07 however on discharge patient's heart rate back in the 120s.  CT angiogram ordered to rule out pulmonary embolism, TSH sent.  Discussed patient will need follow-up with cardiology as she has had a heart rate low 100s recently PO potassium given .  CT scan no blood clot.  Thyroid test sent and will need outpatient follow-up due to delayed results at this emergency room.  Patient to follow-up with primary doctor and cardiology for further work-up.  Patient's heart rate around 105 at discharge. Suzanne Fletcher was evaluated in Emergency Department on 03/27/2019 for the symptoms described in the history of present illness. She was evaluated in the context of the global COVID-19 pandemic, which necessitated consideration that the patient might be at risk for infection with the SARS-CoV-2 virus that causes COVID-19. Institutional protocols and algorithms that pertain to the evaluation of patients at risk for COVID-19 are in a state of rapid change based on information released by regulatory bodies including the CDC and federal and state organizations. These policies and algorithms were followed during the patient's care in the ED.   Final Clinical  Impressions(s) / ED Diagnoses   Final diagnoses:  Shortness of breath  Chest pain on breathing  Hypokalemia  Mild intermittent asthma with acute exacerbation    ED Discharge Orders    None       Blane OharaZavitz, Shadow Schedler, MD 03/27/19 1344

## 2019-03-27 NOTE — ED Provider Notes (Signed)
MSE was initiated and I personally evaluated the patient and placed orders (if any) at  6:51 AM on March 27, 2019.  The patient appears stable so that the remainder of the MSE may be completed by another provider.  Patient presents with shortness of breath.  Reported history of asthma.  Reports shortness of breath, sore throat, chest discomfort.  Symptoms started on Saturday.  No known fevers or sick contacts.  No improvement with inhaler at home.  On exam she has a muffled voice.  No stridor or wheezing noted on exam.  Posterior oropharynx is erythematous.  X-rays and lab work initiated.  Patient signed out to oncoming provider for full exam.   Merryl Hacker, MD 03/27/19 [phone]

## 2019-03-27 NOTE — ED Notes (Signed)
Pt states feeling better, mom at bedside along with service dog

## 2019-03-27 NOTE — Discharge Instructions (Addendum)
Use inhaler as needed every 2-4 hours. Return to see a clinician if you develop worsening shortness of breath, persistent fevers, passout or new concerns. Follow up your thyroid testing with primary doctor or heart doctor.

## 2019-03-27 NOTE — ED Notes (Signed)
Pt states feeling much better, HR 97, parameters met for discharge per MD

## 2019-03-27 NOTE — ED Notes (Signed)
HR increased to 130/140s following albuterol tx, denies chest pain, denies shortness of breath.  MD notified.

## 2019-04-11 ENCOUNTER — Emergency Department (HOSPITAL_BASED_OUTPATIENT_CLINIC_OR_DEPARTMENT_OTHER)
Admission: EM | Admit: 2019-04-11 | Discharge: 2019-04-11 | Disposition: A | Discharge status: Home / Self Care | Payer: Medicaid Other | Attending: Emergency Medicine | Admitting: Emergency Medicine

## 2019-04-11 ENCOUNTER — Emergency Department (HOSPITAL_BASED_OUTPATIENT_CLINIC_OR_DEPARTMENT_OTHER): Payer: Medicaid Other

## 2019-04-11 ENCOUNTER — Other Ambulatory Visit: Payer: Self-pay

## 2019-04-11 ENCOUNTER — Encounter (HOSPITAL_BASED_OUTPATIENT_CLINIC_OR_DEPARTMENT_OTHER): Payer: Self-pay | Admitting: Emergency Medicine

## 2019-04-11 DIAGNOSIS — R061 Stridor: Secondary | ICD-10-CM | POA: Insufficient documentation

## 2019-04-11 DIAGNOSIS — R0602 Shortness of breath: Secondary | ICD-10-CM | POA: Diagnosis present

## 2019-04-11 DIAGNOSIS — J4520 Mild intermittent asthma, uncomplicated: Secondary | ICD-10-CM | POA: Diagnosis not present

## 2019-04-11 MED ORDER — DEXAMETHASONE SODIUM PHOSPHATE 10 MG/ML IJ SOLN
10.0000 mg | Freq: Once | INTRAMUSCULAR | Status: AC
  Administered 2019-04-11: 04:00:00 10 mg via INTRAMUSCULAR
  Filled 2019-04-11: qty 1

## 2019-04-11 MED ORDER — ALBUTEROL SULFATE HFA 108 (90 BASE) MCG/ACT IN AERS
8.0000 | INHALATION_SPRAY | Freq: Once | RESPIRATORY_TRACT | Status: AC
  Administered 2019-04-11: 2 via RESPIRATORY_TRACT
  Filled 2019-04-11: qty 6.7

## 2019-04-11 NOTE — ED Triage Notes (Signed)
Patient arrived via POV c/o shortness of breath with stridor. Patient presents with upper respiratory wheezing, audible on approach. Patient is AO x 4, VS WDL.

## 2019-04-11 NOTE — ED Provider Notes (Signed)
[facility] EMERGENCY DEPARTMENT Provider Note   CSN: [id number] Arrival date & time: 04/11/19  0331     History   Chief Complaint Chief Complaint  Patient presents with  . Shortness of Breath    HPI Suzanne Fletcher is a 28 y.o. female.     The history is provided by the patient.  Shortness of Breath Severity:  Moderate Onset quality:  Sudden Timing:  Constant Progression:  Unchanged Chronicity:  Recurrent Context: not strong odors, not URI and not weather changes   Relieved by:  Nothing Worsened by:  Nothing Ineffective treatments:  None tried Associated symptoms: wheezing   Associated symptoms: no chest pain, no cough, no diaphoresis, no ear pain, no fever, no PND, no sore throat, no swollen glands and no vomiting   Risk factors: no recent alcohol use and no hx of PE/DVT     Past Medical History:  Diagnosis Date  . Asthma   . Blind in both eyes   . Migraine   . Seizures (HCC)     There are no active problems to display for this patient.   Past Surgical History:  Procedure Laterality Date  . EYE SURGERY       OB History   No obstetric history on file.      Home Medications    Prior to Admission medications   Medication Sig Start Date End Date Taking? Authorizing Provider  albuterol (PROVENTIL HFA;VENTOLIN HFA) 108 (90 Base) MCG/ACT inhaler Inhale into the lungs.    [provider]  cyclobenzaprine (FLEXERIL) 10 MG tablet Take 1 tablet (10 mg total) by mouth 3 (three) times daily as needed for muscle spasms. 11/02/16   Cammy Copaean, Gregory Scott, MD  ibuprofen (ADVIL,MOTRIN) 200 MG tablet Take by mouth.    [provider]  levETIRAcetam (KEPPRA) 750 MG tablet Take by mouth. 06/08/16   [provider]  levocetirizine (XYZAL) 5 MG tablet Take by mouth.    [provider]  metaxalone (SKELAXIN) 800 MG tablet 1 pill every 6 hours as needed 07/02/10   [provider]  montelukast (SINGULAIR) 10 MG tablet Take  by mouth.    [provider]  Pimozide 1 MG TABS Take by mouth. 10/23/15   [provider]  traMADol (ULTRAM) 50 MG tablet Take by mouth.    [provider]  verapamil (VERELAN PM) 120 MG 24 hr capsule Take 120 mg by mouth at bedtime.    [provider]    Family History History reviewed. No pertinent family history.  Social History Social History   Tobacco Use  . Smoking status: Never Smoker  . Smokeless tobacco: Never Used  Substance Use Topics  . Alcohol use: No  . Drug use: Not on file     Allergies   Ampicillin, Cefadroxil, Cefixime, Cephalosporins, Penicillins, and Sulfa antibiotics   Review of Systems Review of Systems  Constitutional: Negative for diaphoresis and fever.  HENT: Negative for ear pain and sore throat.   Respiratory: Positive for shortness of breath and wheezing. Negative for cough, choking and stridor.   Cardiovascular: Negative for chest pain, palpitations, leg swelling and PND.  Gastrointestinal: Negative for vomiting.  All other systems reviewed and are negative.    Physical Exam Updated Vital Signs BP 120/75 (BP Location: Left Arm)   Pulse (!) 130   Temp 98.2 F (36.8 C) (Oral)   Resp (!) 22   Ht 5\' 6"  (1.676 m)   Wt 59 kg  LMP  (LMP Unknown)   SpO2 99%   BMI 20.98 kg/m   Physical Exam Vitals signs and nursing note reviewed.  Constitutional:      General: She is not in acute distress.    Appearance: She is normal weight.  HENT:     Head: Normocephalic and atraumatic.     Nose: Nose normal.  Eyes:     Conjunctiva/sclera: Conjunctivae normal.     Pupils: Pupils are equal, round, and reactive to light.  Neck:     Musculoskeletal: Normal range of motion and neck supple.  Cardiovascular:     Rate and Rhythm: Regular rhythm. Tachycardia present.     Pulses: Normal pulses.  Pulmonary:     Effort: Pulmonary effort is normal. No respiratory distress.     Breath sounds: No stridor. Wheezing  present. No rhonchi or rales.     Comments: No stridor, no inspiratory sounds Chest:     Chest wall: No tenderness.  Abdominal:     General: Abdomen is flat. Bowel sounds are normal.     Tenderness: There is no abdominal tenderness. There is no rebound.     Hernia: No hernia is present.  Musculoskeletal: Normal range of motion.  Skin:    General: Skin is warm and dry.     Capillary Refill: Capillary refill takes less than 2 seconds.  Neurological:     General: No focal deficit present.     Mental Status: She is alert and oriented to person, place, and time.  Psychiatric:        Mood and Affect: Mood normal.        Behavior: Behavior normal.      ED Treatments / Results  Labs (all labs ordered are listed, but only abnormal results are displayed) Labs Reviewed - No data to display  EKG None  Radiology No results found.  Procedures Procedures (including critical care time)  Medications Ordered in ED Medications  albuterol (VENTOLIN HFA) 108 (90 Base) MCG/ACT inhaler 8 puff (2 puffs Inhalation Given 04/11/19 0357)  dexamethasone (DECADRON) injection 10 mg (10 mg Intramuscular Given 04/11/19 0419)     I believe there is a component of anxiety.  Patient has been worked up for CoVID and PE for this same thing.  Better post medication. I believe a lot of this is anxiety as it is distractable.  Have instructed more frequent nebs at home.  Patient is on PO steroids currently.   Stable for discharge.    Final Clinical Impressions(s) / ED Diagnoses   Return for intractable cough, coughing up blood,fevers >100.4 unrelieved by medication, shortness of breath, intractable vomiting, chest pain, shortness of breath, weakness,numbness, changes in speech, facial asymmetry,abdominal pain, passing out,Inability to tolerate liquids or food, cough, altered mental status or any concerns. No signs of systemic illness or infection. The patient is nontoxic-appearing on exam and vital signs  are within normal limits.   I have reviewed the triage vital signs and the nursing notes. Pertinent labs &imaging results that were available during my care of the patient were reviewed by me and considered in my medical decision making (see chart for details).  After history, exam, and medical workup I feel the patient has been appropriately medically screened and is safe for discharge home. Pertinent diagnoses were discussed with the patient. Patient was given return precautions    Nikiesha Milford, MD 04/11/19 6606

## 2019-04-11 NOTE — ED Notes (Signed)
Patient exhibits signs of anxiety and discomfort when staff is in room, however, when observed without patient being aware, patient at ease with no signs of distress. Provider informed.

## 2019-10-12 ENCOUNTER — Emergency Department (HOSPITAL_BASED_OUTPATIENT_CLINIC_OR_DEPARTMENT_OTHER)
Admission: EM | Admit: 2019-10-12 | Discharge: 2019-10-12 | Disposition: A | Discharge status: Home / Self Care | Payer: Medicaid Other | Attending: Emergency Medicine | Admitting: Emergency Medicine

## 2019-10-12 ENCOUNTER — Emergency Department (HOSPITAL_BASED_OUTPATIENT_CLINIC_OR_DEPARTMENT_OTHER): Payer: Medicaid Other

## 2019-10-12 ENCOUNTER — Other Ambulatory Visit: Payer: Self-pay

## 2019-10-12 ENCOUNTER — Encounter (HOSPITAL_BASED_OUTPATIENT_CLINIC_OR_DEPARTMENT_OTHER): Payer: Self-pay | Admitting: *Deleted

## 2019-10-12 DIAGNOSIS — Z79899 Other long term (current) drug therapy: Secondary | ICD-10-CM | POA: Diagnosis not present

## 2019-10-12 DIAGNOSIS — W06XXXA Fall from bed, initial encounter: Secondary | ICD-10-CM | POA: Insufficient documentation

## 2019-10-12 DIAGNOSIS — S0990XA Unspecified injury of head, initial encounter: Secondary | ICD-10-CM | POA: Diagnosis present

## 2019-10-12 DIAGNOSIS — S0003XA Contusion of scalp, initial encounter: Secondary | ICD-10-CM | POA: Diagnosis not present

## 2019-10-12 DIAGNOSIS — Y999 Unspecified external cause status: Secondary | ICD-10-CM | POA: Diagnosis not present

## 2019-10-12 DIAGNOSIS — R569 Unspecified convulsions: Secondary | ICD-10-CM | POA: Insufficient documentation

## 2019-10-12 DIAGNOSIS — J45909 Unspecified asthma, uncomplicated: Secondary | ICD-10-CM | POA: Insufficient documentation

## 2019-10-12 DIAGNOSIS — Y9389 Activity, other specified: Secondary | ICD-10-CM | POA: Insufficient documentation

## 2019-10-12 DIAGNOSIS — S060X0A Concussion without loss of consciousness, initial encounter: Secondary | ICD-10-CM | POA: Diagnosis not present

## 2019-10-12 DIAGNOSIS — Y92003 Bedroom of unspecified non-institutional (private) residence as the place of occurrence of the external cause: Secondary | ICD-10-CM | POA: Insufficient documentation

## 2019-10-12 DIAGNOSIS — W228XXA Striking against or struck by other objects, initial encounter: Secondary | ICD-10-CM | POA: Diagnosis not present

## 2019-10-12 MED ORDER — PROMETHAZINE HCL 25 MG/ML IJ SOLN
25.0000 mg | Freq: Four times a day (QID) | INTRAMUSCULAR | Status: DC | PRN
  Administered 2019-10-12: 25 mg via INTRAMUSCULAR
  Filled 2019-10-12: qty 1

## 2019-10-12 MED ORDER — KETOROLAC TROMETHAMINE 60 MG/2ML IM SOLN
30.0000 mg | Freq: Once | INTRAMUSCULAR | Status: AC
  Administered 2019-10-12: 30 mg via INTRAMUSCULAR
  Filled 2019-10-12: qty 2

## 2019-10-12 NOTE — ED Provider Notes (Signed)
[facility] EMERGENCY DEPARTMENT Provider Note   CSN: [id number] Arrival date & time: 10/12/19  1214     History Chief Complaint  Patient presents with   Fall   Head Injury    Suzanne Fletcher is a 29 y.o. female.  The history is provided by the patient and medical records. No language interpreter was used.  Fall  Head Injury  Suzanne Fletcher is a 29 y.o. female who presents to the Emergency Department complaining of fall with head injury. She presents the emergency department accompanied by her mother for evaluation of injuries following a fall that occurred yesterday. She fell out of bed yesterday and struck her head. Her mother was watching her at home and doing neurologic checks every two hours. Today she complains of progressive and worsening headache. Her mother also feels like her balance is slightly worse than usual and that her memory is not as good as usual. She does have a history of seizure disorder, blindness, migraine headaches. No recent medication changes or missed doses of medications. She has taken Tylenol and ibuprofen at home with no significant change in her symptoms. They reports of fevers, nausea, vomiting, chest pain, abdominal pain. No additional injuries. Symptoms are moderate and constant nature.    Past Medical History:  Diagnosis Date   Asthma    Blind in both eyes    Migraine    Seizures (HCC)     There are no problems to display for this patient.   Past Surgical History:  Procedure Laterality Date   EYE SURGERY       OB History   No obstetric history on file.     No family history on file.  Social History   Tobacco Use   Smoking status: Never Smoker   Smokeless tobacco: Never Used  Substance Use Topics   Alcohol use: No   Drug use: Not on file    Home Medications Prior to Admission medications   Medication Sig Start Date End Date Taking? Authorizing Provider  albuterol (PROVENTIL HFA;VENTOLIN HFA) 108  (90 Base) MCG/ACT inhaler Inhale into the lungs.   Yes [provider]  budesonide-formoterol (SYMBICORT) 80-4.5 MCG/ACT inhaler  04/27/19  Yes [provider]  cyclobenzaprine (FLEXERIL) 10 MG tablet Take 1 tablet (10 mg total) by mouth 3 (three) times daily as needed for muscle spasms. 11/02/16  Yes Cammy Copa, MD  Erenumab-aooe (AIMOVIG) 140 MG/ML SOAJ Inject into the skin. 06/25/19  Yes [provider]  ibuprofen (ADVIL,MOTRIN) 200 MG tablet Take by mouth.   Yes [provider]  levETIRAcetam (KEPPRA) 750 MG tablet Take by mouth. 06/08/16  Yes [provider]  levocetirizine (XYZAL) 5 MG tablet Take by mouth.   Yes [provider]  metaxalone (SKELAXIN) 800 MG tablet 1 pill every 6 hours as needed 07/02/10  Yes [provider]  montelukast (SINGULAIR) 10 MG tablet Take by mouth.   Yes [provider]  Pimozide 1 MG TABS Take by mouth. 10/23/15  Yes [provider]  traMADol (ULTRAM) 50 MG tablet Take by mouth.   Yes [provider]  verapamil (VERELAN PM) 120 MG 24 hr capsule Take 120 mg by mouth at bedtime.   Yes [provider]    Allergies    Ampicillin, Cefadroxil, Cefixime, Cephalosporins, Penicillins, and Sulfa antibiotics  Review of Systems   Review of Systems  All other systems reviewed and are negative.   Physical Exam Updated Vital Signs BP Marland Kitchen)  133/91    Pulse (!) 112    Temp 98.7 F (37.1 C) (Oral)    Resp 18    Ht 5\' 6"  (1.676 m)    Wt 59 kg    LMP 09/28/2019    SpO2 100%    BMI 20.98 kg/m   Physical Exam Vitals and nursing note reviewed.  Constitutional:      Appearance: She is well-developed.  HENT:     Head: Normocephalic and atraumatic.  Cardiovascular:     Rate and Rhythm: Normal rate and regular rhythm.     Heart sounds: No murmur.  Pulmonary:     Effort: Pulmonary effort is normal. No respiratory distress.     Breath sounds: Normal breath sounds.    Abdominal:     Palpations: Abdomen is soft.     Tenderness: There is no abdominal tenderness. There is no guarding or rebound.  Musculoskeletal:        General: No tenderness.     Cervical back: Neck supple.  Skin:    General: Skin is warm and dry.     Coloration: Skin is pale.  Neurological:     Mental Status: She is alert.     Comments: Blind. Sees hand in front of her face but unable to track. No asymmetry of facial movements. Five out of five strength and bilateral upper extremities. Five out of five strength and left lower extremity. 4/5 strength in right lower extremity. Sensation light touch intact and bilateral upper, left lower extremities. Altered sensation to light touch in the distal left lower extremity. Patient reports that her right lower extremity numbness and weakness is an ongoing issue for her.  Psychiatric:     Comments: Mildly anxious appearing.     ED Results / Procedures / Treatments   Labs (all labs ordered are listed, but only abnormal results are displayed) Labs Reviewed - No data to display  EKG None  Radiology CT Head Wo Contrast  Result Date: 10/12/2019 CLINICAL DATA:  Headache with recent fall.  Difficulty balancing EXAM: CT HEAD WITHOUT CONTRAST TECHNIQUE: Contiguous axial images were obtained from the base of the skull through the vertex without intravenous contrast. COMPARISON:  March 29, 2004 FINDINGS: Brain: The ventricles and sulci are normal in size and configuration. There is no intracranial mass, hemorrhage, extra-axial fluid collection, or midline shift. The brain parenchyma appears unremarkable. No acute infarct is evident. Vascular: No hyperdense vessel.  No evident vascular calcification. Skull: The bony calvarium appears intact. Sinuses/Orbits: Visualized paranasal sinuses are clear. Visualized orbits appear symmetric bilaterally. Other: Mastoid air cells are clear. IMPRESSION: Study within normal limits. Electronically Signed   By: March 31, 2004 III M.D.   On: 10/12/2019 13:03    Procedures Procedures (including critical care time)  Medications Ordered in ED Medications  ketorolac (TORADOL) injection 30 mg (has no administration in time range)  promethazine (PHENERGAN) injection 25 mg (has no administration in time range)    ED Course  I have reviewed the triage vital signs and the nursing notes.  Pertinent labs & imaging results that were available during my care of the patient were reviewed by me and considered in my medical decision making (see chart for details).    MDM Rules/Calculators/A&P                     Patient here for evaluation of headache following a fall yesterday. Due to persistent headache, mother's reports of balance issues CT head was obtained.  CT is negative for subdural hematoma or acute abnormality. Discussed with patient and mother findings of concussion, will treat symptomatically with Toradol, Phenergan. Discussed outpatient neurology follow-up as well as return precautions. Presentation is not consistent with subarachnoid hemorrhage, meningitis, dural sinus thrombosis.  Final Clinical Impression(s) / ED Diagnoses Final diagnoses:  Contusion of scalp, initial encounter  Concussion without loss of consciousness, initial encounter    Rx / DC Orders ED Discharge Orders    None       Quintella Reichert, MD 10/12/19 1325

## 2019-10-12 NOTE — ED Triage Notes (Signed)
She fell out of bed this am hitting the back of her head. Hematoma felt. No LOC. Her mom and service dog are with her. She is alert oriented. Ambulatory.

## 2019-10-12 NOTE — ED Notes (Signed)
Larey Seat out of bed yesterday  Hit head  Mom states is not remembering things after she has been told  C/o head that is not like her migraines   Mom and service dog at bedside

## 2019-11-18 ENCOUNTER — Emergency Department (HOSPITAL_BASED_OUTPATIENT_CLINIC_OR_DEPARTMENT_OTHER)
Admission: EM | Admit: 2019-11-18 | Discharge: 2019-11-18 | Disposition: A | Discharge status: Home / Self Care | Payer: Medicaid Other | Attending: Emergency Medicine | Admitting: Emergency Medicine

## 2019-11-18 ENCOUNTER — Encounter (HOSPITAL_BASED_OUTPATIENT_CLINIC_OR_DEPARTMENT_OTHER): Payer: Self-pay | Admitting: Emergency Medicine

## 2019-11-18 ENCOUNTER — Other Ambulatory Visit: Payer: Self-pay

## 2019-11-18 DIAGNOSIS — R202 Paresthesia of skin: Secondary | ICD-10-CM | POA: Insufficient documentation

## 2019-11-18 DIAGNOSIS — R200 Anesthesia of skin: Secondary | ICD-10-CM | POA: Diagnosis present

## 2019-11-18 DIAGNOSIS — Z79899 Other long term (current) drug therapy: Secondary | ICD-10-CM | POA: Insufficient documentation

## 2019-11-18 DIAGNOSIS — G5622 Lesion of ulnar nerve, left upper limb: Secondary | ICD-10-CM

## 2019-11-18 DIAGNOSIS — J45909 Unspecified asthma, uncomplicated: Secondary | ICD-10-CM | POA: Insufficient documentation

## 2019-11-18 NOTE — ED Triage Notes (Signed)
Pt states that she has had left arm numbness and tingling since she had blood work done on 11/09/19.

## 2019-11-18 NOTE — Discharge Instructions (Signed)
Clinically the most likely thing you have is an ulnar neuropathy.  Because you have pain and your symptoms are reproduced in multiple locations is hard to know exactly where this is occurring.  Could occur anywhere along which the nerve runs from your neck.  Wear the sling as needed for comfort.  Please remove it at least 4 times a day to move your arm around.  Also wear the wrist splint for comfort but especially in the evening as that is the time which people tend to put compression on the nerve.  The neurologist is a specialist who could figure out exactly where the issue is.  Please call them and discuss your symptoms and see when they want to see you in the office.

## 2019-11-18 NOTE — ED Provider Notes (Signed)
[facility] EMERGENCY DEPARTMENT Provider Note   CSN: [id number] Arrival date & time: 11/18/19  1810     History Chief Complaint  Patient presents with  . Left arm tingling and numbness    Suzanne Fletcher is a 29 y.o. female.  29 yo F with a chief complaints of left hand tingling.  This is mostly on the ulnar aspect of the hand.  Having some weakness to the hand as well.  Patient unfortunately has a tic where she will jerk her left arm and sometimes will hit walls or what ever happens to be in the way.  Denies any specific injury that she knows of.  Felt like a tearing sensation to her forearm at the onset.  Happened right after she had blood drawn for routine lab work.  States that they stuck her in her [facility].  No issue with blood draw as far she knows.  Denies neck pain.  The history is provided by the patient.  Illness Severity:  Moderate Onset quality:  Gradual Duration:  8 days Timing:  Constant Progression:  Worsening Chronicity:  New Associated symptoms: no chest pain, no congestion, no fever, no headaches, no myalgias, no nausea, no rhinorrhea, no shortness of breath, no vomiting and no wheezing        Past Medical History:  Diagnosis Date  . Asthma   . Blind in both eyes   . Migraine   . Seizures (HCC)     There are no problems to display for this patient.   Past Surgical History:  Procedure Laterality Date  . EYE SURGERY       OB History   No obstetric history on file.     History reviewed. No pertinent family history.  Social History   Tobacco Use  . Smoking status: Never Smoker  . Smokeless tobacco: Never Used  Substance Use Topics  . Alcohol use: No  . Drug use: Not on file    Home Medications Prior to Admission medications   Medication Sig Start Date End Date Taking? Authorizing Provider  albuterol (PROVENTIL HFA;VENTOLIN HFA) 108 (90 Base) MCG/ACT inhaler Inhale into the lungs.    [provider]    budesonide-formoterol [facility]) 80-4.5 MCG/ACT inhaler  04/27/19   [provider]  cyclobenzaprine (FLEXERIL) 10 MG tablet Take 1 tablet (10 mg total) by mouth 3 (three) times daily as needed for muscle spasms. 11/02/16   Cammy Copa, MD  Erenumab-aooe (AIMOVIG) 140 MG/ML SOAJ Inject into the skin. 06/25/19   [provider]  ibuprofen (ADVIL,MOTRIN) 200 MG tablet Take by mouth.    [provider]  levETIRAcetam (KEPPRA) 750 MG tablet Take by mouth. 06/08/16   [provider]  levocetirizine (XYZAL) 5 MG tablet Take by mouth.    [provider]  metaxalone (SKELAXIN) 800 MG tablet 1 pill every 6 hours as needed 07/02/10   [provider]  montelukast (SINGULAIR) 10 MG tablet Take by mouth.    [provider]  Pimozide 1 MG TABS Take by mouth. 10/23/15   [provider]  traMADol (ULTRAM) 50 MG tablet Take by mouth.    [provider]  verapamil (VERELAN PM) 120 MG 24 hr capsule Take 120 mg by mouth at bedtime.    [provider]    Allergies    Ampicillin, Cefadroxil, Cefixime, Cephalosporins, Penicillins, and Sulfa antibiotics  Review of Systems   Review of Systems  Constitutional: Negative for chills and fever.  HENT:  Negative for congestion and rhinorrhea.   Eyes: Negative for redness and visual disturbance.  Respiratory: Negative for shortness of breath and wheezing.   Cardiovascular: Negative for chest pain and palpitations.  Gastrointestinal: Negative for nausea and vomiting.  Genitourinary: Negative for dysuria and urgency.  Musculoskeletal: Negative for arthralgias and myalgias.  Skin: Negative for pallor and wound.  Neurological: Positive for numbness (tingling to hand). Negative for dizziness and headaches.    Physical Exam Updated Vital Signs BP (!) 131/91 (BP Location: Right Arm)   Pulse (!) 49   Temp 98 F (36.7 C) (Oral)   Resp 18   SpO2 100%   Physical Exam Vitals and  nursing note reviewed.  Constitutional:      General: She is not in acute distress.    Appearance: She is well-developed. She is not diaphoretic.  HENT:     Head: Normocephalic and atraumatic.  Eyes:     Pupils: Pupils are equal, round, and reactive to light.  Cardiovascular:     Rate and Rhythm: Normal rate and regular rhythm.     Heart sounds: No murmur. No friction rub. No gallop.   Pulmonary:     Effort: Pulmonary effort is normal.     Breath sounds: No wheezing or rales.  Abdominal:     General: There is no distension.     Palpations: Abdomen is soft.     Tenderness: There is no abdominal tenderness.  Musculoskeletal:        General: No tenderness.     Cervical back: Normal range of motion and neck supple.     Comments: Intact sensation to light touch in the median radial and ulnar nerve distributions of the hand.  Intact motor but decreased strength diffusely but worse in the ulnar distribution.  Positive Tinel about the cubital canal.  Also along the ulnar canal.  She does have some left trapezius tenderness that also reproduces her hand symptoms.  Full range of motion of the elbow.  Pulses 2+ and equal to the other side.  Negative Spurling's.  Skin:    General: Skin is warm and dry.  Neurological:     Mental Status: She is alert and oriented to person, place, and time.  Psychiatric:        Behavior: Behavior normal.     ED Results / Procedures / Treatments   Labs (all labs ordered are listed, but only abnormal results are displayed) Labs Reviewed - No data to display  EKG None  Radiology No results found.  Procedures Procedures (including critical care time)  Medications Ordered in ED Medications - No data to display  ED Course  I have reviewed the triage vital signs and the nursing notes.  Pertinent labs & imaging results that were available during my care of the patient were reviewed by me and considered in my medical decision making (see chart for  details).    MDM Rules/Calculators/A&P                      29 yo F with a chief complaints of left hand tingling.  This started after having a blood draw.  Not sure how this is related to the blood draw.  Was drawn from the [facility] and seems most likely to be an ulnar neuropathy based on history and exam.  She also has multiple areas of tenderness along the course of the nerve.  Will place her in a wrist splint and a sling.  Have  her follow-up with neurology for consideration for further imaging or EMG.  7:11 PM:  I have discussed the diagnosis/risks/treatment options with the patient and family and believe the pt to be eligible for discharge home to follow-up with Neuro, PCP. We also discussed returning to the ED immediately if new or worsening sx occur. We discussed the sx which are most concerning (e.g., sudden worsening pain, fever, inability to tolerate by mouth) that necessitate immediate return. Medications administered to the patient during their visit and any new prescriptions provided to the patient are listed below.  Medications given during this visit Medications - No data to display   The patient appears reasonably screen and/or stabilized for discharge and I doubt any other medical condition or other [facility] requiring further screening, evaluation, or treatment in the ED at this time prior to discharge.   Final Clinical Impression(s) / ED Diagnoses Final diagnoses:  Ulnar neuropathy of left upper extremity    Rx / DC Orders ED Discharge Orders    None       Deno Etienne, DO 11/18/19 1911

## 2020-05-19 ENCOUNTER — Ambulatory Visit (INDEPENDENT_AMBULATORY_CARE_PROVIDER_SITE_OTHER): Payer: Medicaid Other | Admitting: Orthopedic Surgery

## 2020-05-19 ENCOUNTER — Encounter: Payer: Self-pay | Admitting: Orthopedic Surgery

## 2020-05-19 ENCOUNTER — Ambulatory Visit: Payer: Self-pay

## 2020-05-19 VITALS — Ht 66.0 in | Wt 130.0 lb

## 2020-05-19 DIAGNOSIS — M25511 Pain in right shoulder: Secondary | ICD-10-CM | POA: Diagnosis not present

## 2020-05-20 ENCOUNTER — Encounter: Payer: Self-pay | Admitting: Orthopedic Surgery

## 2020-05-20 NOTE — Progress Notes (Signed)
Office Visit Note   Patient: Suzanne Fletcher           Date of Birth: 1991-01-20           MRN: [id number] Visit Date: 05/19/2020 Requested by: Lupita Raider, MD [address] [place],  [place] [address] PCP: Lupita Raider, MD  Subjective: Chief Complaint  Patient presents with  . Right Shoulder - Pain    HPI: Suzanne Fletcher is a 29 year old patient right shoulder "dislocation".  She was standing and looking at her phone and the shoulder "popped".  She states that the chiropractor put it back into place the first several times.  She does not feel like it is in place at this time.  She feels some popping when she moves it.  Takes Motrin with some relief.  She is on a host of other medications.  She is currently undergoing some type of occupational therapy for her wrist.              ROS: All systems reviewed are negative as they relate to the chief complaint within the history of present illness.  Patient denies  fevers or chills.   Assessment & Plan: Visit Diagnoses:  1. Acute pain of right shoulder     Plan: Impression is normal right shoulder examination with no apprehension or instability.  Rotator cuff strength is good.  Radiographs unremarkable.  Her history does not really equate to the the amount of energy needed to dislocate the shoulder.  Labral pathology also unlikely based on the mechanism of injury.  Hard to say exactly what is going on with the shoulder but I do not think it is anything operative and it is hard to say that the shoulder was frankly dislocated based on available evidence.  I would favor a course of physical therapy for 6 weeks before any further imaging.  We will see her back in 6 to 8 weeks if she is not improved.  Follow-Up Instructions: Return if symptoms worsen or fail to improve.   Orders:  Orders Placed This Encounter  Procedures  . XR Shoulder Right   No orders of the defined types were placed in this encounter.     Procedures: No  procedures performed   Clinical Data: No additional findings.  Objective: Vital Signs: Ht 5\' 6"  (1.676 m)   Wt 130 lb (59 kg)   BMI 20.98 kg/m   Physical Exam:   Constitutional: Patient appears well-developed HEENT:  Head: Normocephalic Eyes:EOM are normal Neck: Normal range of motion Cardiovascular: Normal rate Pulmonary/chest: Effort normal Neurologic: Patient is alert Skin: Skin is warm Psychiatric: Patient has normal mood and affect    Ortho Exam: Ortho exam demonstrates full active and passive range of motion of the right shoulder.  Negative anterior and posterior apprehension with about 1+ anterior posterior instability less than a centimeter sulcus sign.  O'Brien's testing equivocal on the right negative on the left.  No discrete AC joint tenderness is present.  Do not get a lot of coarse grinding crepitus or mechanical symptoms with passive range of motion of that right arm.  No scapular dyskinesia with forward flexion.  Cervical spine range of motion is full.  Specialty Comments:  No specialty comments available.  Imaging: No results found.   PMFS History: There are no problems to display for this patient.  Past Medical History:  Diagnosis Date  . Asthma   . Blind in both eyes   . Migraine   .  Seizures (HCC)     History reviewed. No pertinent family history.  Past Surgical History:  Procedure Laterality Date  . EYE SURGERY     Social History   Occupational History  . Not on file  Tobacco Use  . Smoking status: Never Smoker  . Smokeless tobacco: Never Used  Vaping Use  . Vaping Use: Never used  Substance and Sexual Activity  . Alcohol use: No  . Drug use: Not on file  . Sexual activity: Not on file

## 2020-06-11 ENCOUNTER — Other Ambulatory Visit: Payer: Self-pay

## 2020-06-11 ENCOUNTER — Ambulatory Visit (INDEPENDENT_AMBULATORY_CARE_PROVIDER_SITE_OTHER): Payer: Medicaid Other | Admitting: Family Medicine

## 2020-06-11 ENCOUNTER — Encounter: Payer: Self-pay | Admitting: Family Medicine

## 2020-06-11 DIAGNOSIS — M25511 Pain in right shoulder: Secondary | ICD-10-CM | POA: Diagnosis not present

## 2020-06-11 DIAGNOSIS — M249 Joint derangement, unspecified: Secondary | ICD-10-CM | POA: Diagnosis not present

## 2020-06-11 MED ORDER — HYDROCODONE-ACETAMINOPHEN 5-325 MG PO TABS: 1.0000 | ORAL_TABLET | Freq: Four times a day (QID) | ORAL | 0 refills | Status: AC | PRN

## 2020-06-11 MED ORDER — BACLOFEN 10 MG PO TABS: 5.0000 mg | ORAL_TABLET | Freq: Three times a day (TID) | ORAL | 3 refills | Status: DC | PRN

## 2020-06-11 NOTE — Progress Notes (Signed)
   Office Visit Note   Patient: Suzanne Fletcher           Date of Birth: 1991/04/02           MRN: [id number] Visit Date: 06/11/2020 Requested by: Lupita Raider, MD [address] [place],  [place] [address] PCP: Lupita Raider, MD  Subjective: Chief Complaint  Patient presents with  . Right Shoulder - Pain    Continues to have the feeling that the shoulder is popping out of place. Having pain into the middle part of her back - around the shoulder blades. Shivers but she is not cold. Her arms shake.    HPI: She is here with persistent right shoulder pain.  She saw Dr. August Saucer for popping in her shoulder which occurred while looking at her phone.  She felt as though the shoulder had popped out of place.  She went to her chiropractor who "relocated" the shoulder, but it was only temporary relief.  It has continued to pop intermittently since then.  She reports various areas of pain in her body, and injuries that occur with out a good reason.  Upon questioning, she reports that she is hypermobile in many of her joints.              ROS:   All other systems were reviewed and are negative.  Objective: Vital Signs: There were no vitals taken for this visit.  Physical Exam:  General:  Alert and oriented, in no acute distress. Pulm:  Breathing unlabored. Psy:  Normal mood, congruent affect.  Right shoulder: Full active range of motion.  She does have some palpable popping in her shoulder with active range of motion.  Rotator cuff strength is 5/5 throughout.  No detectable instability today.   Imaging: No results found.  Assessment & Plan: 1.  Right shoulder pain, possibly due to impingement but cannot rule out labrum pathology. -She is scheduled for physical therapy next week at [facility].  Gave her a refill of hydrocodone and baclofen.  Follow-up as needed for this.  If pain persist, could try a subacromial injection and if that does not help, MRI arthrogram.  2.  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 -Referral to Dr. Darrick Penna for further evaluation and treatment options.     Procedures: No procedures performed  No notes on file     PMFS History: There are no problems to display for this patient.  Past Medical History:  Diagnosis Date  . Asthma   . Blind in both eyes   . Migraine   . Seizures (HCC)     History reviewed. No pertinent family history.  Past Surgical History:  Procedure Laterality Date  . EYE SURGERY     Social History   Occupational History  . Not on file  Tobacco Use  . Smoking status: Never Smoker  . Smokeless tobacco: Never Used  Vaping Use  . Vaping Use: Never used  Substance and Sexual Activity  . Alcohol use: No  . Drug use: Not on file  . Sexual activity: Not on file

## 2020-06-16 ENCOUNTER — Other Ambulatory Visit: Payer: Self-pay

## 2020-06-16 ENCOUNTER — Ambulatory Visit (INDEPENDENT_AMBULATORY_CARE_PROVIDER_SITE_OTHER): Payer: Medicaid Other | Admitting: Family Medicine

## 2020-06-16 ENCOUNTER — Encounter: Payer: Self-pay | Admitting: Family Medicine

## 2020-06-16 VITALS — BP 112/70 | HR 87 | Ht 66.0 in

## 2020-06-16 DIAGNOSIS — S83001A Unspecified subluxation of right patella, initial encounter: Secondary | ICD-10-CM

## 2020-06-16 DIAGNOSIS — S43001A Unspecified subluxation of right shoulder joint, initial encounter: Secondary | ICD-10-CM

## 2020-06-16 DIAGNOSIS — M357 Hypermobility syndrome: Secondary | ICD-10-CM

## 2020-06-16 MED ORDER — CELECOXIB 200 MG PO CAPS: ORAL_CAPSULE | ORAL | 1 refills | Status: DC

## 2020-06-16 NOTE — Progress Notes (Signed)
Suzanne Fletcher - 29 y.o. female MRN [id number]  Date of birth: 1990-12-31  SUBJECTIVE:  Including CC & ROS.  Chief Complaint  Patient presents with  . Shoulder Pain    right  . Back Pain    mid to lower    Suzanne Fletcher is a 29 y.o. female that is presenting with concerns of hypermobility.  She has had ongoing symptoms in her right knee as her right shoulder.  She has subluxations of the patella as well as the shoulder.  It seems of gotten worse as of late.  She has had intermittent instances of bedrest due to severe migraines.  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  She has had genetic testing with no formal diagnosis.  She does have a history of blindness.  She has had different studies and have not revealed any structural changes..  Independent review of the right shoulder x-ray from 8/16 shows no acute abnormality.   Review of Systems See HPI   HISTORY: Past Medical, Surgical, Social, and Family History Reviewed & Updated per EMR.   Pertinent Historical Findings include:  Past Medical History:  Diagnosis Date  . Asthma   . Blind in both eyes   . Migraine   . Seizures (HCC)     Past Surgical History:  Procedure Laterality Date  . EYE SURGERY      No family history on file.  Social History   Socioeconomic History  . Marital status: Single    Spouse name: Not on file  . Number of children: Not on file  . Years of education: Not on file  . Highest education level: Not on file  Occupational History  . Not on file  Tobacco Use  . Smoking status: Never Smoker  . Smokeless tobacco: Never Used  Vaping Use  . Vaping Use: Never used  Substance and Sexual Activity  . Alcohol use: No  . Drug use: Not on file  . Sexual activity: Not on file  Other Topics Concern  . Not on file  Social History Narrative  . Not on file   Social Determinants of Health   Financial Resource Strain:   . Difficulty of Paying Living Expenses: Not on file   Food Insecurity:   . Worried About Programme researcher, broadcasting/film/video in the Last Year: Not on file  . Ran Out of Food in the Last Year: Not on file  Transportation Needs:   . Lack of Transportation (Medical): Not on file  . Lack of Transportation (Non-Medical): Not on file  Physical Activity:   . Days of Exercise per Week: Not on file  . Minutes of Exercise per Session: Not on file  Stress:   . Feeling of Stress : Not on file  Social Connections:   . Frequency of Communication with Friends and Family: Not on file  . Frequency of Social Gatherings with Friends and Family: Not on file  . Attends Religious Services: Not on file  . Active Member of Clubs or Organizations: Not on file  . Attends Banker Meetings: Not on file  . Marital Status: Not on file  Intimate Partner Violence:   . Fear of Current or Ex-Partner: Not on file  . Emotionally Abused: Not on file  . Physically Abused: Not on file  . Sexually Abused: Not on file     PHYSICAL EXAM:  VS: BP 112/70   Pulse 87   Ht 5\' 6"  (  1.676 m)   BMI 20.98 kg/m  Physical Exam Gen: NAD, alert, cooperative with exam, well-appearing MSK:  Contact hands flat on floor. Can take thumb to forearm. Pinky fingers can go past 90 degrees. Hyperextension of the elbows. 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  Has a history of blindness and subluxations of different joints.  Likely contributing to her ongoing pain.  She is going through physical therapy at [facility].  Counseled on home exercise and stabilization.   -Celebrex. -Continue baclofen. -Can use Vicodin for severe pain. -Could consider amitriptyline or Cymbalta.  Shoulder subluxation, right, initial encounter Imaging has been normal.  Occurs intermittently and likely having minor subluxations. -Counseled on home exercise therapy and supportive care. -Could consider glenohumeral injection. -Could consider sully  brace.  Subluxation of right patella Has had an MRI of her knee in 2017 as well as 2018.  No significant structural changes identified.  Likely her hypermobility is contributing to her persisting pain. -Counseled on home exercise therapy and supportive care. -Could consider bracing.

## 2020-06-16 NOTE — Patient Instructions (Signed)
Nice to meet you Please try the exercises  Please try the celebrex  Please try the exercises  Please let me know about the compression   Please send me a message in MyChart with any questions or updates.  Please see me back in 4 weeks.   --Dr. Jordan Likes

## 2020-06-17 DIAGNOSIS — S43001D Unspecified subluxation of right shoulder joint, subsequent encounter: Secondary | ICD-10-CM | POA: Insufficient documentation

## 2020-06-17 DIAGNOSIS — M357 Hypermobility syndrome: Secondary | ICD-10-CM | POA: Insufficient documentation

## 2020-06-17 DIAGNOSIS — S83001D Unspecified subluxation of right patella, subsequent encounter: Secondary | ICD-10-CM | POA: Insufficient documentation

## 2020-06-17 DIAGNOSIS — S83001A Unspecified subluxation of right patella, initial encounter: Secondary | ICD-10-CM | POA: Insufficient documentation

## 2020-06-17 NOTE — Assessment & Plan Note (Addendum)
Beighton score of 7/9.  Has a history of blindness and subluxations of different joints.  Likely contributing to her ongoing pain.  She is going through physical therapy at [facility].  Counseled on home exercise and stabilization.   -Celebrex. -Continue baclofen. -Can use Vicodin for severe pain. -Could consider amitriptyline or Cymbalta.

## 2020-06-17 NOTE — Assessment & Plan Note (Signed)
Imaging has been normal.  Occurs intermittently and likely having minor subluxations. -Counseled on home exercise therapy and supportive care. -Could consider glenohumeral injection. -Could consider sully brace.

## 2020-06-17 NOTE — Assessment & Plan Note (Signed)
Has had an MRI of her knee in 2017 as well as 2018.  No significant structural changes identified.  Likely her hypermobility is contributing to her persisting pain. -Counseled on home exercise therapy and supportive care. -Could consider bracing.

## 2020-07-16 ENCOUNTER — Other Ambulatory Visit: Payer: Self-pay

## 2020-07-16 ENCOUNTER — Ambulatory Visit (INDEPENDENT_AMBULATORY_CARE_PROVIDER_SITE_OTHER): Payer: Medicaid Other | Admitting: Family Medicine

## 2020-07-16 ENCOUNTER — Encounter: Payer: Self-pay | Admitting: Family Medicine

## 2020-07-16 VITALS — BP 104/72 | HR 97 | Ht 66.0 in | Wt 140.0 lb

## 2020-07-16 DIAGNOSIS — E559 Vitamin D deficiency, unspecified: Secondary | ICD-10-CM | POA: Insufficient documentation

## 2020-07-16 DIAGNOSIS — M357 Hypermobility syndrome: Secondary | ICD-10-CM

## 2020-07-16 DIAGNOSIS — S43001D Unspecified subluxation of right shoulder joint, subsequent encounter: Secondary | ICD-10-CM

## 2020-07-16 DIAGNOSIS — R252 Cramp and spasm: Secondary | ICD-10-CM | POA: Diagnosis not present

## 2020-07-16 DIAGNOSIS — M21371 Foot drop, right foot: Secondary | ICD-10-CM | POA: Diagnosis not present

## 2020-07-16 NOTE — Assessment & Plan Note (Signed)
Stemming from injury from about 4 years ago. Was worked up at that time.  - could consider new AFO  - B12 and folate.

## 2020-07-16 NOTE — Assessment & Plan Note (Signed)
Has cramping in her toes and legs. Nerve study was completed a few years ago for other problems - magnesium  - has been found to be low in iron. Receiving supplementation. Could consider infusion.

## 2020-07-16 NOTE — Assessment & Plan Note (Signed)
Having an acute flare of some of her back pain.  - referral to integrative medicine  - continue celebrex  - continue baclofen.

## 2020-07-16 NOTE — Patient Instructions (Signed)
Good to see you Integrative medicine will give you a call  I will call with the results from today   Please send me a message in MyChart with any questions or updates.  Please see me back in 4-6 weeks.   --Dr. Jordan Likes

## 2020-07-16 NOTE — Assessment & Plan Note (Signed)
Has a lack of UV exposure due to migraines.  - vitamin D

## 2020-07-16 NOTE — Assessment & Plan Note (Signed)
Has gotten improvement with physical therapy. Range of motion has improved.  - continue physical therapy  - continue compression.

## 2020-07-16 NOTE — Progress Notes (Signed)
Suzanne Fletcher - 29 y.o. female MRN [id number]  Date of birth: May 04, 1991  SUBJECTIVE:  Including CC & ROS.  Chief Complaint  Patient presents with  . Follow-up    right shoulder and lower back    Suzanne Fletcher is a 29 y.o. female that is following up for her multiple areas of pain.  She has been doing physical therapy and does get improvement with her shoulder range of motion.  Currently she seems to have more pain in her back.  She has a history of foot drop from a previous injury about 3 or 4 years ago.   Review of Systems See HPI   HISTORY: Past Medical, Surgical, Social, and Family History Reviewed & Updated per EMR.   Pertinent Historical Findings include:  Past Medical History:  Diagnosis Date  . Asthma   . Blind in both eyes   . Migraine   . Seizures (HCC)     Past Surgical History:  Procedure Laterality Date  . EYE SURGERY      No family history on file.  Social History   Socioeconomic History  . Marital status: Single    Spouse name: Not on file  . Number of children: Not on file  . Years of education: Not on file  . Highest education level: Not on file  Occupational History  . Not on file  Tobacco Use  . Smoking status: Never Smoker  . Smokeless tobacco: Never Used  Vaping Use  . Vaping Use: Never used  Substance and Sexual Activity  . Alcohol use: No  . Drug use: Not on file  . Sexual activity: Not on file  Other Topics Concern  . Not on file  Social History Narrative  . Not on file   Social Determinants of Health   Financial Resource Strain:   . Difficulty of Paying Living Expenses: Not on file  Food Insecurity:   . Worried About Programme researcher, broadcasting/film/video in the Last Year: Not on file  . Ran Out of Food in the Last Year: Not on file  Transportation Needs:   . Lack of Transportation (Medical): Not on file  . Lack of Transportation (Non-Medical): Not on file  Physical Activity:   . Days of Exercise per Week: Not on file  . Minutes of  Exercise per Session: Not on file  Stress:   . Feeling of Stress : Not on file  Social Connections:   . Frequency of Communication with Friends and Family: Not on file  . Frequency of Social Gatherings with Friends and Family: Not on file  . Attends Religious Services: Not on file  . Active Member of Clubs or Organizations: Not on file  . Attends Banker Meetings: Not on file  . Marital Status: Not on file  Intimate Partner Violence:   . Fear of Current or Ex-Partner: Not on file  . Emotionally Abused: Not on file  . Physically Abused: Not on file  . Sexually Abused: Not on file     PHYSICAL EXAM:  VS: BP 104/72   Pulse 97   Ht 5\' 6"  (1.676 m)   Wt 140 lb (63.5 kg)   BMI 22.60 kg/m  Physical Exam Gen: NAD, alert, cooperative with exam, well-appearing MSK:  Right shoulder: Normal external rotation internal rotation. Pain with external rotation and abduction. Normal grip strength. Neurovascularly intact     ASSESSMENT & PLAN:   Shoulder subluxation, right, subsequent encounter Has gotten improvement with physical therapy.  Range of motion has improved.  - continue physical therapy  - continue compression.    Hypermobility syndrome Having an acute flare of some of her back pain.  - referral to integrative medicine  - continue celebrex  - continue baclofen.   Leg cramps Has cramping in her toes and legs. Nerve study was completed a few years ago for other problems - magnesium  - has been found to be low in iron. Receiving supplementation. Could consider infusion.   Right foot drop Stemming from injury from about 4 years ago. Was worked up at that time.  - could consider new AFO  - B12 and folate.   Vitamin D deficiency Has a lack of UV exposure due to migraines.  - vitamin D

## 2020-07-24 ENCOUNTER — Telehealth: Payer: Self-pay | Admitting: Family Medicine

## 2020-07-24 LAB — VITAMIN D 1,25 DIHYDROXY
Vitamin D 1, 25 (OH)2 Total: 52 pg/mL
Vitamin D2 1, 25 (OH)2: <10 pg/mL
Vitamin D3 1, 25 (OH)2: 52 pg/mL

## 2020-07-24 LAB — MAGNESIUM: Magnesium: 2 mg/dL (ref 1.6–2.3)

## 2020-07-24 LAB — B12 AND FOLATE PANEL
Folate: 15.2 ng/mL (ref >3.0)
Vitamin B-12: 455 pg/mL (ref 232–1245)

## 2020-07-24 NOTE — Telephone Encounter (Signed)
Left VM for patient. If she calls back please have her speak with a nurse/CMA and inform that her labs are normal.   If any questions then please take the best time and phone number to call and I will try to call her back.   Myra Rude, MD [facility] 07/24/2020, 8:34 AM

## 2020-08-10 ENCOUNTER — Other Ambulatory Visit: Payer: Self-pay | Admitting: Family Medicine

## 2020-08-10 DIAGNOSIS — S43001A Unspecified subluxation of right shoulder joint, initial encounter: Secondary | ICD-10-CM

## 2020-08-10 DIAGNOSIS — S83001A Unspecified subluxation of right patella, initial encounter: Secondary | ICD-10-CM

## 2020-08-20 ENCOUNTER — Encounter: Payer: Self-pay | Admitting: Family Medicine

## 2020-08-20 ENCOUNTER — Other Ambulatory Visit: Payer: Self-pay

## 2020-08-20 ENCOUNTER — Ambulatory Visit (INDEPENDENT_AMBULATORY_CARE_PROVIDER_SITE_OTHER): Payer: Medicaid Other | Admitting: Family Medicine

## 2020-08-20 ENCOUNTER — Ambulatory Visit (HOSPITAL_BASED_OUTPATIENT_CLINIC_OR_DEPARTMENT_OTHER)
Admission: RE | Admit: 2020-08-20 | Discharge: 2020-08-20 | Disposition: A | Discharge status: Home / Self Care | Payer: Medicaid Other | Source: Ambulatory Visit | Attending: Family Medicine | Admitting: Family Medicine

## 2020-08-20 VITALS — BP 111/74 | HR 82 | Ht 66.0 in | Wt 145.0 lb

## 2020-08-20 DIAGNOSIS — S43002A Unspecified subluxation of left shoulder joint, initial encounter: Secondary | ICD-10-CM | POA: Insufficient documentation

## 2020-08-20 DIAGNOSIS — S83001A Unspecified subluxation of right patella, initial encounter: Secondary | ICD-10-CM | POA: Diagnosis not present

## 2020-08-20 DIAGNOSIS — M21371 Foot drop, right foot: Secondary | ICD-10-CM | POA: Diagnosis not present

## 2020-08-20 DIAGNOSIS — M357 Hypermobility syndrome: Secondary | ICD-10-CM | POA: Diagnosis not present

## 2020-08-20 DIAGNOSIS — S43001A Unspecified subluxation of right shoulder joint, initial encounter: Secondary | ICD-10-CM

## 2020-08-20 MED ORDER — CELECOXIB 200 MG PO CAPS: ORAL_CAPSULE | ORAL | 1 refills | Status: DC

## 2020-08-20 NOTE — Progress Notes (Signed)
Suzanne Fletcher - 29 y.o. female MRN [id number]  Date of birth: 1991-05-21  SUBJECTIVE:  Including CC & ROS.  Chief Complaint  Patient presents with  . Follow-up    right shoulder    Suzanne Fletcher is a 29 y.o. female that is presenting with right foot drop and acute left shoulder pain.  Has been feeling well with physical therapy.   Review of Systems See HPI   HISTORY: Past Medical, Surgical, Social, and Family History Reviewed & Updated per EMR.   Pertinent Historical Findings include:  Past Medical History:  Diagnosis Date  . Asthma   . Blind in both eyes   . Migraine   . Seizures (HCC)     Past Surgical History:  Procedure Laterality Date  . EYE SURGERY      No family history on file.  Social History   Socioeconomic History  . Marital status: Single    Spouse name: Not on file  . Number of children: Not on file  . Years of education: Not on file  . Highest education level: Not on file  Occupational History  . Not on file  Tobacco Use  . Smoking status: Never Smoker  . Smokeless tobacco: Never Used  Vaping Use  . Vaping Use: Never used  Substance and Sexual Activity  . Alcohol use: No  . Drug use: Not on file  . Sexual activity: Not on file  Other Topics Concern  . Not on file  Social History Narrative  . Not on file   Social Determinants of Health   Financial Resource Strain:   . Difficulty of Paying Living Expenses: Not on file  Food Insecurity:   . Worried About Programme researcher, broadcasting/film/video in the Last Year: Not on file  . Ran Out of Food in the Last Year: Not on file  Transportation Needs:   . Lack of Transportation (Medical): Not on file  . Lack of Transportation (Non-Medical): Not on file  Physical Activity:   . Days of Exercise per Week: Not on file  . Minutes of Exercise per Session: Not on file  Stress:   . Feeling of Stress : Not on file  Social Connections:   . Frequency of Communication with Friends and Family: Not on file  .  Frequency of Social Gatherings with Friends and Family: Not on file  . Attends Religious Services: Not on file  . Active Member of Clubs or Organizations: Not on file  . Attends Banker Meetings: Not on file  . Marital Status: Not on file  Intimate Partner Violence:   . Fear of Current or Ex-Partner: Not on file  . Emotionally Abused: Not on file  . Physically Abused: Not on file  . Sexually Abused: Not on file     PHYSICAL EXAM:  VS: BP 111/74   Pulse 82   Ht 5\' 6"  (1.676 m)   Wt 145 lb (65.8 kg)   LMP 08/13/2020   BMI 23.40 kg/m  Physical Exam Gen: NAD, alert, cooperative with exam, well-appearing MSK:  Left shoulder: Normal range of motion. Normal strength resistance. Right foot: Lack of dorsiflexion. Drags foot with walking. Neurovascular intact     ASSESSMENT & PLAN:   Subluxation of left shoulder joint Felt that she had a subluxation of her left shoulder this week. -Counseled on home exercise therapy and supportive care. -X-ray. -Can continue physical therapy.  Hypermobility syndrome Has been feeling well most recently. -Refilled Celebrex.  Right foot  drop Evidence on exam of foot drop. -Carbon fiber AFO.

## 2020-08-20 NOTE — Assessment & Plan Note (Signed)
Evidence on exam of foot drop. -Carbon fiber AFO.

## 2020-08-20 NOTE — Assessment & Plan Note (Signed)
Felt that she had a subluxation of her left shoulder this week. -Counseled on home exercise therapy and supportive care. -X-ray. -Can continue physical therapy.

## 2020-08-20 NOTE — Assessment & Plan Note (Signed)
Has been feeling well most recently. -Refilled Celebrex.

## 2020-08-20 NOTE — Patient Instructions (Signed)
Good to see you I will call with the results from today.   Please send me a message in MyChart with any questions or updates.  Please see me back in 2 months.   --Dr. Jordan Likes

## 2020-08-21 ENCOUNTER — Telehealth: Payer: Self-pay | Admitting: Family Medicine

## 2020-08-21 NOTE — Telephone Encounter (Signed)
Informed of results.   Myra Rude, MD [facility] 08/21/2020, 8:59 AM

## 2020-10-08 ENCOUNTER — Other Ambulatory Visit: Payer: Self-pay

## 2020-10-08 ENCOUNTER — Ambulatory Visit (INDEPENDENT_AMBULATORY_CARE_PROVIDER_SITE_OTHER): Payer: Medicaid Other | Admitting: Family Medicine

## 2020-10-08 DIAGNOSIS — G5603 Carpal tunnel syndrome, bilateral upper limbs: Secondary | ICD-10-CM

## 2020-10-08 DIAGNOSIS — G5600 Carpal tunnel syndrome, unspecified upper limb: Secondary | ICD-10-CM | POA: Insufficient documentation

## 2020-10-08 MED ORDER — PREDNISONE 5 MG PO TABS: ORAL_TABLET | ORAL | 0 refills | Status: DC

## 2020-10-08 NOTE — Patient Instructions (Signed)
Good to see you Please try the braces and at night.  Please try the prednisone   Please send me a message in MyChart with any questions or updates.  Please see me back in 2 weeks or as needed if better.   --Dr. Jordan Likes

## 2020-10-08 NOTE — Progress Notes (Signed)
  Suzanne Fletcher - 30 y.o. female MRN [id number]  Date of birth: March 30, 1991  SUBJECTIVE:  Including CC & ROS.  No chief complaint on file.   Suzanne Fletcher is a 30 y.o. female that is presenting with acute right and left hand pain.  This is been ongoing for a couple of days.  She denies any specific inciting event or trauma.  Has a history of similar pain.  Has been playing more video games.  Localized to the anterior forearm and radiates distally to the palm of the hand.  No history of surgery in this area.  Review of Systems See HPI   HISTORY: Past Medical, Surgical, Social, and Family History Reviewed & Updated per EMR.   Pertinent Historical Findings include:  Past Medical History:  Diagnosis Date  . Asthma   . Blind in both eyes   . Migraine   . Seizures (HCC)     Past Surgical History:  Procedure Laterality Date  . EYE SURGERY      No family history on file.  Social History   Socioeconomic History  . Marital status: Single    Spouse name: Not on file  . Number of children: Not on file  . Years of education: Not on file  . Highest education level: Not on file  Occupational History  . Not on file  Tobacco Use  . Smoking status: Never Smoker  . Smokeless tobacco: Never Used  Vaping Use  . Vaping Use: Never used  Substance and Sexual Activity  . Alcohol use: No  . Drug use: Not on file  . Sexual activity: Not on file  Other Topics Concern  . Not on file  Social History Narrative  . Not on file   Social Determinants of Health   Financial Resource Strain: Not on file  Food Insecurity: Not on file  Transportation Needs: Not on file  Physical Activity: Not on file  Stress: Not on file  Social Connections: Not on file  Intimate Partner Violence: Not on file     PHYSICAL EXAM:  VS: BP 112/70   Ht 5\' 6"  (1.676 m)   Wt 139 lb (63 kg)   BMI 22.44 kg/m  Physical Exam Gen: NAD, alert, cooperative with exam, well-appearing MSK:  Right and left  ankle No swelling or ecchymosis. Tenderness to palpation over the carpal tunnel. Normal range of motion. Neurovascularly intact     ASSESSMENT & PLAN:   Carpal tunnel syndrome Symptoms are likely associated with carpal tunnel. -Counseled on home exercise therapy and supportive care. -Right wrist brace.  Counseled on the brace use. -Prednisone. -Could consider injection of physical therapy.

## 2020-10-08 NOTE — Assessment & Plan Note (Signed)
Symptoms are likely associated with carpal tunnel. -Counseled on home exercise therapy and supportive care. -Right wrist brace.  Counseled on the brace use. -Prednisone. -Could consider injection of physical therapy.

## 2020-10-14 ENCOUNTER — Other Ambulatory Visit: Payer: Self-pay | Admitting: Family Medicine

## 2020-10-14 DIAGNOSIS — S43001A Unspecified subluxation of right shoulder joint, initial encounter: Secondary | ICD-10-CM

## 2020-10-14 DIAGNOSIS — S83001A Unspecified subluxation of right patella, initial encounter: Secondary | ICD-10-CM

## 2020-10-20 ENCOUNTER — Other Ambulatory Visit: Payer: Self-pay

## 2020-10-20 ENCOUNTER — Telehealth (INDEPENDENT_AMBULATORY_CARE_PROVIDER_SITE_OTHER): Payer: Medicaid Other | Admitting: Family Medicine

## 2020-10-20 DIAGNOSIS — S43001A Unspecified subluxation of right shoulder joint, initial encounter: Secondary | ICD-10-CM | POA: Diagnosis not present

## 2020-10-20 DIAGNOSIS — G5603 Carpal tunnel syndrome, bilateral upper limbs: Secondary | ICD-10-CM | POA: Diagnosis not present

## 2020-10-20 DIAGNOSIS — S83001A Unspecified subluxation of right patella, initial encounter: Secondary | ICD-10-CM | POA: Diagnosis not present

## 2020-10-20 MED ORDER — CELECOXIB 200 MG PO CAPS: ORAL_CAPSULE | ORAL | 1 refills | Status: DC

## 2020-10-20 NOTE — Progress Notes (Signed)
Virtual Visit via Video Note  I connected with Suzanne Fletcher on 10/20/20 at  9:50 AM EST by a video enabled telemedicine application and verified that I am speaking with the correct person using two identifiers.  Location: Patient: home Provider: home   I discussed the limitations of evaluation and management by telemedicine and the availability of in person appointments. The patient expressed understanding and agreed to proceed.  History of Present Illness:  Ms. Pitz is a 30 yo F that is following up for her bilateral carpal tunnel. She has gotten improvement. She continues PT and does feel stronger.   Observations/Objective:  Gen: NAD, alert, cooperative with exam, well-appearing  Assessment and Plan:  Carpal tunnel syndrome:  Has had improvement.  - counseled on home exercise therapy and supportive care - can try braces at night if needed  Follow Up Instructions:    I discussed the assessment and treatment plan with the patient. The patient was provided an opportunity to ask questions and all were answered. The patient agreed with the plan and demonstrated an understanding of the instructions.   The patient was advised to call back or seek an in-person evaluation if the symptoms worsen or if the condition fails to improve as anticipated.    Clare Gandy, MD

## 2020-10-20 NOTE — Assessment & Plan Note (Signed)
Has had improvement.  - counseled on home exercise therapy and supportive care - can try braces at night if needed

## 2020-10-20 NOTE — Assessment & Plan Note (Signed)
No recent subluxation.  - counseled on home exercise therapy and supportive care - refilled celebrex.

## 2020-12-02 ENCOUNTER — Ambulatory Visit (HOSPITAL_BASED_OUTPATIENT_CLINIC_OR_DEPARTMENT_OTHER)
Admission: RE | Admit: 2020-12-02 | Discharge: 2020-12-02 | Disposition: A | Discharge status: Home / Self Care | Payer: Medicaid Other | Source: Ambulatory Visit | Attending: Family Medicine | Admitting: Family Medicine

## 2020-12-02 ENCOUNTER — Ambulatory Visit: Payer: Self-pay

## 2020-12-02 ENCOUNTER — Ambulatory Visit (INDEPENDENT_AMBULATORY_CARE_PROVIDER_SITE_OTHER): Payer: Medicaid Other | Admitting: Family Medicine

## 2020-12-02 ENCOUNTER — Other Ambulatory Visit: Payer: Self-pay

## 2020-12-02 VITALS — BP 90/62 | Ht 66.0 in | Wt 139.0 lb

## 2020-12-02 DIAGNOSIS — S43002A Unspecified subluxation of left shoulder joint, initial encounter: Secondary | ICD-10-CM | POA: Insufficient documentation

## 2020-12-02 DIAGNOSIS — M25512 Pain in left shoulder: Secondary | ICD-10-CM

## 2020-12-02 NOTE — Patient Instructions (Signed)
Good to see you Please try compression  Please alternate heat and ice  I will call with the results from today   Please send me a message in MyChart with any questions or updates.  Please see me back in 4 weeks.   --Dr. Jordan Likes

## 2020-12-02 NOTE — Progress Notes (Signed)
  Suzanne Fletcher - 30 y.o. female MRN [id number]  Date of birth: 06/10/1991  SUBJECTIVE:  Including CC & ROS.  No chief complaint on file.   Suzanne Fletcher is a 30 y.o. female that is presenting with left shoulder pain.  She had an episode recently where she felt her shoulder come out of the socket.  She was able to get it back into place.    No history of surgery.   Review of Systems See HPI   HISTORY: Past Medical, Surgical, Social, and Family History Reviewed & Updated per EMR.   Pertinent Historical Findings include:  Past Medical History:  Diagnosis Date  . Asthma   . Blind in both eyes   . Migraine   . Seizures (HCC)     Past Surgical History:  Procedure Laterality Date  . EYE SURGERY      No family history on file.  Social History   Socioeconomic History  . Marital status: Single    Spouse name: Not on file  . Number of children: Not on file  . Years of education: Not on file  . Highest education level: Not on file  Occupational History  . Not on file  Tobacco Use  . Smoking status: Never Smoker  . Smokeless tobacco: Never Used  Vaping Use  . Vaping Use: Never used  Substance and Sexual Activity  . Alcohol use: No  . Drug use: Not on file  . Sexual activity: Not on file  Other Topics Concern  . Not on file  Social History Narrative  . Not on file   Social Determinants of Health   Financial Resource Strain: Not on file  Food Insecurity: Not on file  Transportation Needs: Not on file  Physical Activity: Not on file  Stress: Not on file  Social Connections: Not on file  Intimate Partner Violence: Not on file     PHYSICAL EXAM:  VS: BP 90/62 (BP Location: Left Arm, Patient Position: Sitting, Cuff Size: Normal)   Ht 5\' 6"  (1.676 m)   Wt 139 lb (63 kg)   BMI 22.44 kg/m  Physical Exam Gen: NAD, alert, cooperative with exam, well-appearing MSK:  Left shoulder: Limited abduction and flexion. Normal internal rotation. Limited external  rotation. Normal grip strength. Neurovascular intact  Limited ultrasound: Left shoulder:  Normal-appearing biceps tendon. Normal-appearing subscapularis and dynamic and static testing. Supraspinatus is intact normal-appearing. No effusion noticed in the posterior glenohumeral joint.  Summary: No structural findings on ultrasound.  Ultrasound and interpretation by , MD    ASSESSMENT & PLAN:   Shoulder subluxation, left, initial encounter Clinical history would suggest more of a subluxation as opposed to a true dislocation.  Has had repeated subluxations before.  Ultrasound findings are reassuring today. -Counseled on home exercise therapy and supportive care. -X-ray obtained -Currently enrolled with physical therapy already. -Could consider injection.

## 2020-12-02 NOTE — Assessment & Plan Note (Signed)
Clinical history would suggest more of a subluxation as opposed to a true dislocation.  Has had repeated subluxations before.  Ultrasound findings are reassuring today. -Counseled on home exercise therapy and supportive care. -X-ray obtained -Currently enrolled with physical therapy already. -Could consider injection.

## 2020-12-03 ENCOUNTER — Telehealth: Payer: Self-pay | Admitting: Family Medicine

## 2020-12-03 NOTE — Telephone Encounter (Signed)
Left VM for patient. If she calls back please have her speak with a nurse/CMA and inform that xray was normal.   If any questions then please take the best time and phone number to call and I will try to call her back.   Myra Rude, MD [facility] 12/03/2020, 8:31 AM

## 2020-12-31 ENCOUNTER — Other Ambulatory Visit: Payer: Self-pay | Admitting: *Deleted

## 2020-12-31 DIAGNOSIS — S83001A Unspecified subluxation of right patella, initial encounter: Secondary | ICD-10-CM

## 2020-12-31 DIAGNOSIS — S43001A Unspecified subluxation of right shoulder joint, initial encounter: Secondary | ICD-10-CM

## 2020-12-31 MED ORDER — CELECOXIB 200 MG PO CAPS: ORAL_CAPSULE | ORAL | 1 refills | Status: DC

## 2021-01-12 ENCOUNTER — Encounter: Payer: Self-pay | Admitting: Family Medicine

## 2021-01-12 ENCOUNTER — Other Ambulatory Visit: Payer: Self-pay

## 2021-01-12 ENCOUNTER — Ambulatory Visit (INDEPENDENT_AMBULATORY_CARE_PROVIDER_SITE_OTHER): Payer: Medicaid Other | Admitting: Family Medicine

## 2021-01-12 DIAGNOSIS — S43002A Unspecified subluxation of left shoulder joint, initial encounter: Secondary | ICD-10-CM

## 2021-01-12 NOTE — Patient Instructions (Signed)
Good to see you Let me know about physical therapy   Please send me a message in MyChart with any questions or updates.  Please see me back in 2 months.   --Dr. Jordan Likes

## 2021-01-12 NOTE — Progress Notes (Signed)
  Suzanne Fletcher - 30 y.o. female MRN [id number]  Date of birth: 1991-05-17  SUBJECTIVE:  Including CC & ROS.  No chief complaint on file.   Suzanne Fletcher is a 30 y.o. female that is following up for her bilateral shoulder pain.  She has had some recurrent recent subluxations but is doing well for the most part.  Has not restarted her physical therapy.  She continues to takes Celebrex as needed for pain.   Review of Systems See HPI   HISTORY: Past Medical, Surgical, Social, and Family History Reviewed & Updated per EMR.   Pertinent Historical Findings include:  Past Medical History:  Diagnosis Date  . Asthma   . Blind in both eyes   . Migraine   . Seizures (HCC)     Past Surgical History:  Procedure Laterality Date  . EYE SURGERY      No family history on file.  Social History   Socioeconomic History  . Marital status: Single    Spouse name: Not on file  . Number of children: Not on file  . Years of education: Not on file  . Highest education level: Not on file  Occupational History  . Not on file  Tobacco Use  . Smoking status: Never Smoker  . Smokeless tobacco: Never Used  Vaping Use  . Vaping Use: Never used  Substance and Sexual Activity  . Alcohol use: No  . Drug use: Not on file  . Sexual activity: Not on file  Other Topics Concern  . Not on file  Social History Narrative  . Not on file   Social Determinants of Health   Financial Resource Strain: Not on file  Food Insecurity: Not on file  Transportation Needs: Not on file  Physical Activity: Not on file  Stress: Not on file  Social Connections: Not on file  Intimate Partner Violence: Not on file     PHYSICAL EXAM:  VS: BP 102/70 (BP Location: Right Arm, Patient Position: Sitting, Cuff Size: Normal)   Ht 5\' 6"  (1.676 m)   Wt 139 lb (63 kg)   BMI 22.44 kg/m  Physical Exam Gen: NAD, alert, cooperative with exam, well-appearing    ASSESSMENT & PLAN:   Shoulder subluxation, left,  initial encounter Has had some recent subluxation but is doing well for the most part. -Counseled on home exercise therapy and supportive care. -She is working on getting back in physical therapy at [facility].

## 2021-01-12 NOTE — Assessment & Plan Note (Signed)
Has had some recent subluxation but is doing well for the most part. -Counseled on home exercise therapy and supportive care. -She is working on getting back in physical therapy at [facility].

## 2021-02-16 ENCOUNTER — Other Ambulatory Visit: Payer: Self-pay | Admitting: Family Medicine

## 2021-03-02 IMAGING — DX DG SHOULDER 2+V*L*
3 series · 3 of 3 positions shown · non-contrast
Comparison: None.

CLINICAL DATA: Left shoulder pain

EXAM:
LEFT SHOULDER - 2+ VIEW

[shoulder grashey]
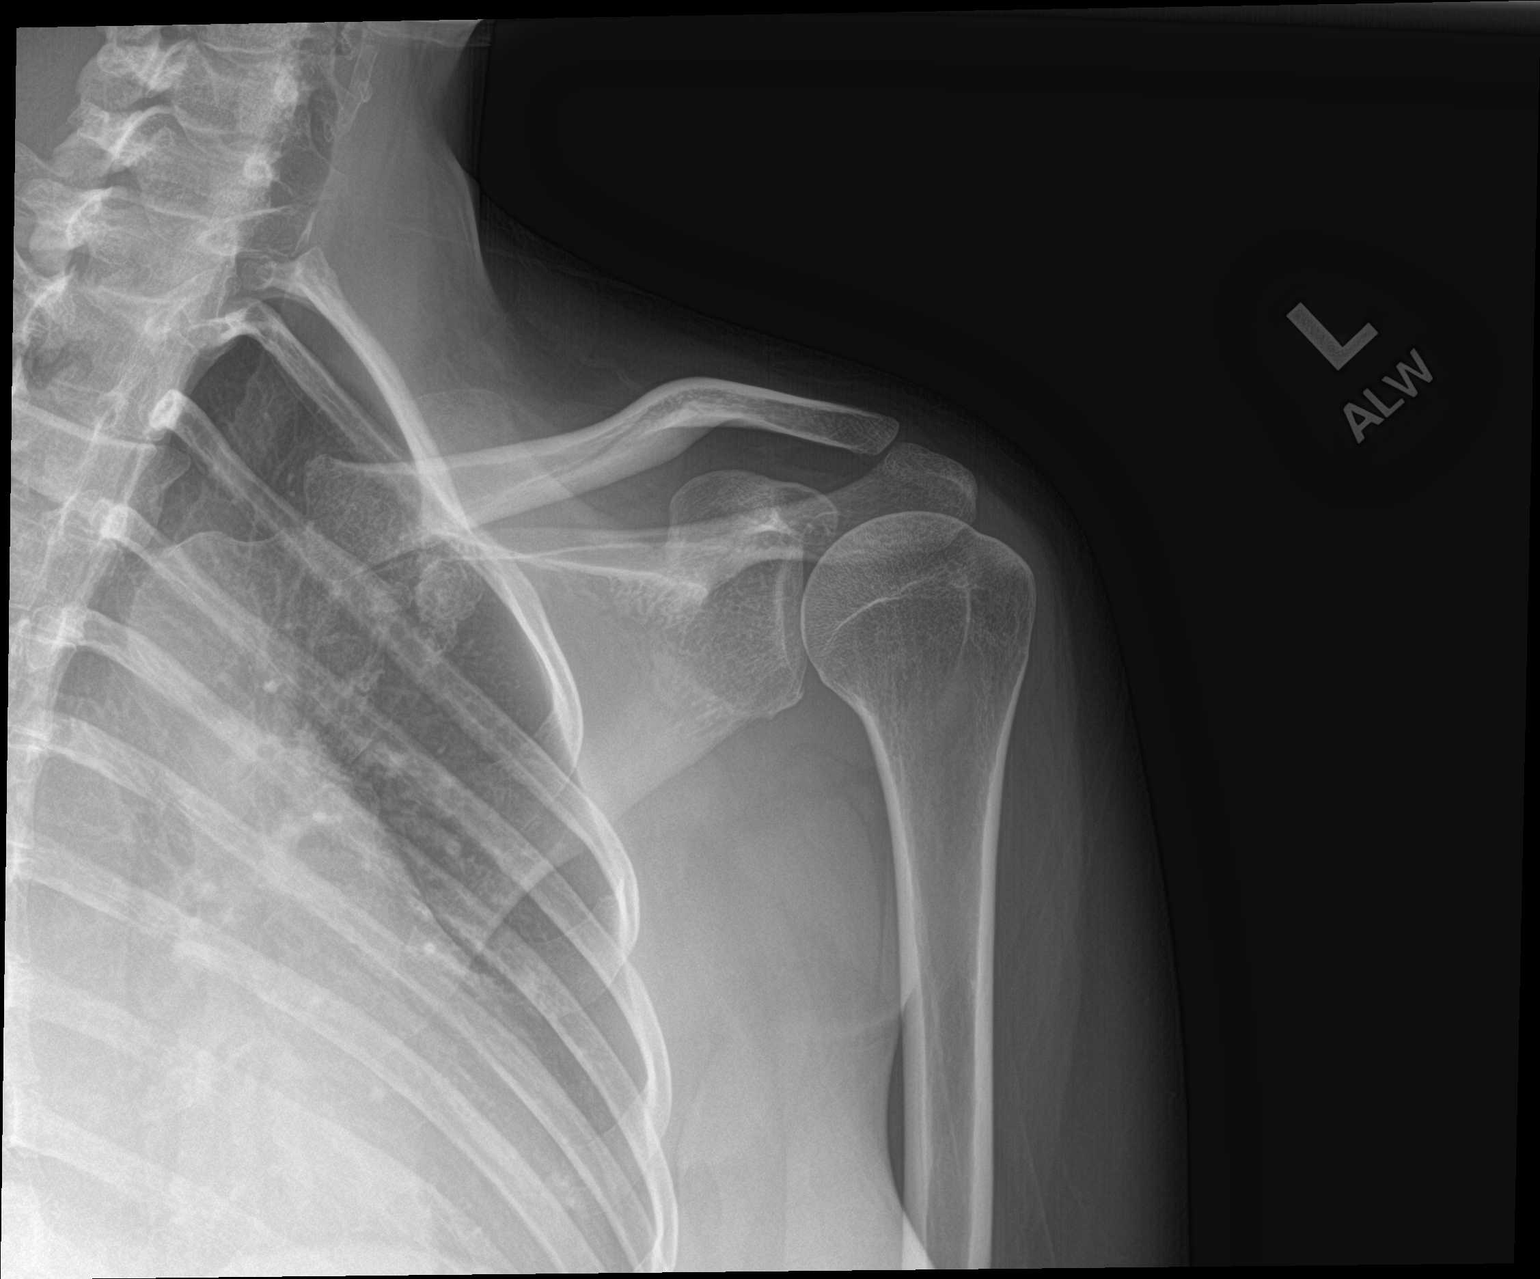

[shoulder y view]
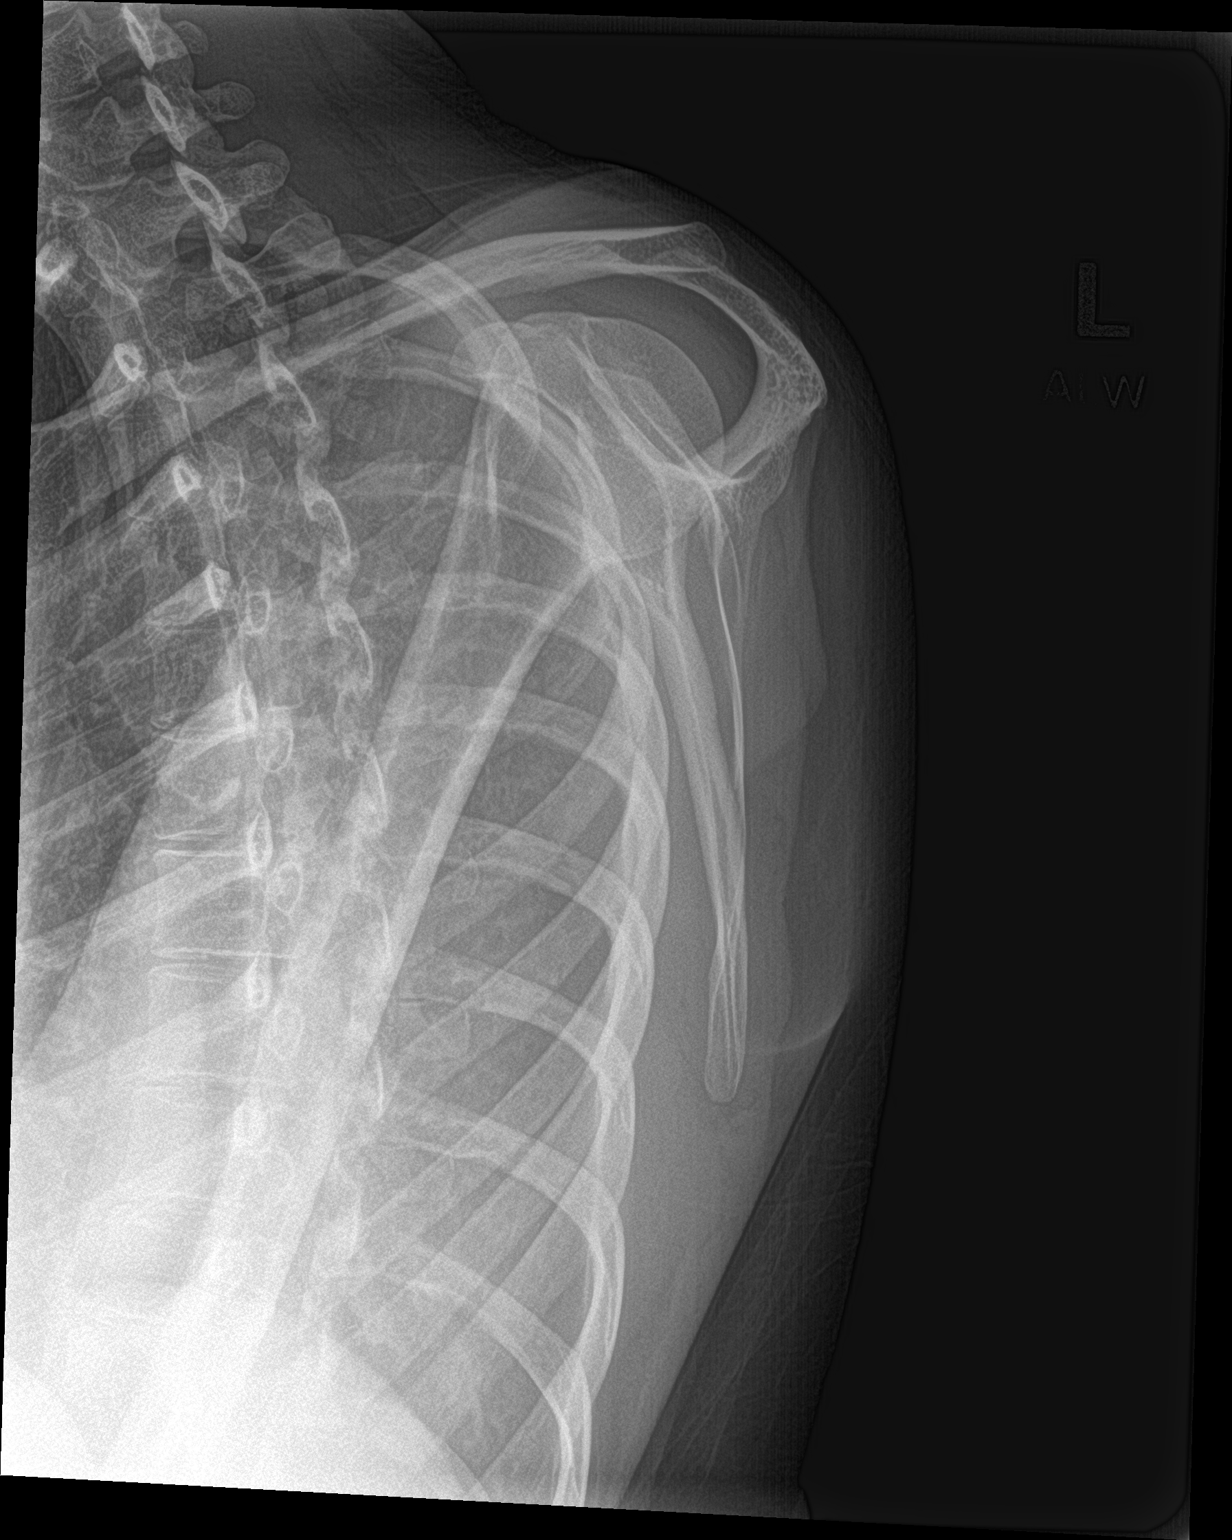

[shoulder axillary]
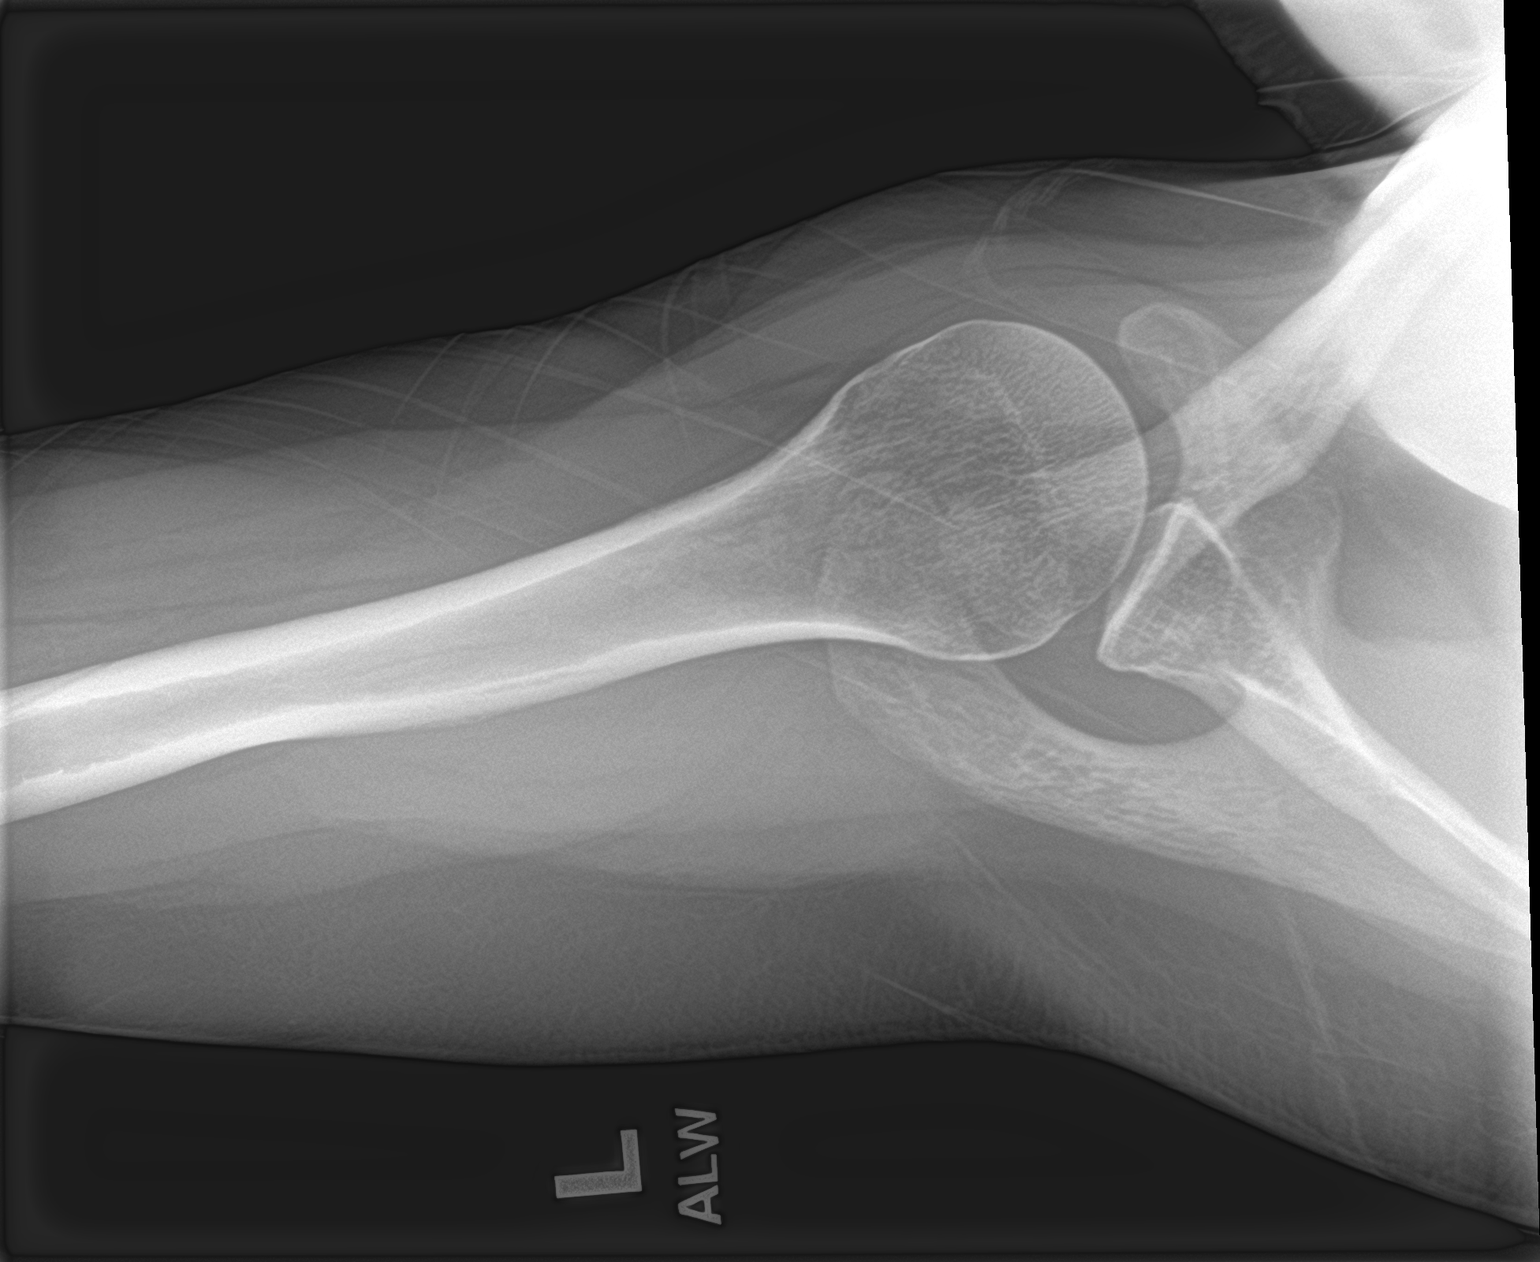

[3 of 3 positions shown; findings below may reference images not displayed]

FINDINGS: There is no evidence of fracture or dislocation. There is no
evidence of arthropathy or other focal bone abnormality. Soft
tissues are unremarkable.
IMPRESSION: No fracture or dislocation of the left shoulder. Joint spaces are
preserved.

## 2021-03-03 ENCOUNTER — Other Ambulatory Visit: Payer: Self-pay | Admitting: Family Medicine

## 2021-03-03 DIAGNOSIS — S43001A Unspecified subluxation of right shoulder joint, initial encounter: Secondary | ICD-10-CM

## 2021-03-03 DIAGNOSIS — S83001A Unspecified subluxation of right patella, initial encounter: Secondary | ICD-10-CM

## 2021-03-04 ENCOUNTER — Ambulatory Visit (INDEPENDENT_AMBULATORY_CARE_PROVIDER_SITE_OTHER): Payer: Medicaid Other | Admitting: Family Medicine

## 2021-03-04 ENCOUNTER — Other Ambulatory Visit: Payer: Self-pay

## 2021-03-04 VITALS — BP 100/60 | Ht 66.0 in | Wt 139.0 lb

## 2021-03-04 DIAGNOSIS — S161XXA Strain of muscle, fascia and tendon at neck level, initial encounter: Secondary | ICD-10-CM | POA: Insufficient documentation

## 2021-03-04 DIAGNOSIS — S63657A Sprain of metacarpophalangeal joint of left little finger, initial encounter: Secondary | ICD-10-CM | POA: Diagnosis not present

## 2021-03-04 NOTE — Patient Instructions (Signed)
Good to see you Please try the splint  Please try the collar  Please try heat on the neck   Please send me a message in MyChart with any questions or updates.  Please see me back in 2 weeks.   --Dr. Jordan Likes

## 2021-03-04 NOTE — Assessment & Plan Note (Signed)
Reports having pain and weakness of the neck posteriorly.  Her head feels heavy from time to time. -Counseled on home exercise therapy and supportive care. -Cervical soft collar. -Could consider trigger point injections.

## 2021-03-04 NOTE — Progress Notes (Signed)
  Suzanne Fletcher - 30 y.o. female MRN [id number]  Date of birth: [birth year]  SUBJECTIVE:  Including CC & ROS.  No chief complaint on file.   Suzanne Fletcher is a 30 y.o. female that is presenting with left hand pain and neck pain.  She feels that her hand was pulled by her dog and leading to pain at the fifth MCP joint.  Having some burning sensation.  She has had nerve damage in the setting previously.  She also feels like she cannot hold her head up well.  She feels like her neck is tired.   Review of Systems See HPI   HISTORY: Past Medical, Surgical, Social, and Family History Reviewed & Updated per EMR.   Pertinent Historical Findings include:  Past Medical History:  Diagnosis Date  . Asthma   . Blind in both eyes   . Migraine   . Seizures (HCC)     Past Surgical History:  Procedure Laterality Date  . EYE SURGERY      No family history on file.  Social History   Socioeconomic History  . Marital status: Single    Spouse name: Not on file  . Number of children: Not on file  . Years of education: Not on file  . Highest education level: Not on file  Occupational History  . Not on file  Tobacco Use  . Smoking status: Never Smoker  . Smokeless tobacco: Never Used  Vaping Use  . Vaping Use: Never used  Substance and Sexual Activity  . Alcohol use: No  . Drug use: Not on file  . Sexual activity: Not on file  Other Topics Concern  . Not on file  Social History Narrative  . Not on file   Social Determinants of Health   Financial Resource Strain: Not on file  Food Insecurity: Not on file  Transportation Needs: Not on file  Physical Activity: Not on file  Stress: Not on file  Social Connections: Not on file  Intimate Partner Violence: Not on file     PHYSICAL EXAM:  VS: BP 100/60 (BP Location: Right Arm, Patient Position: Sitting, Cuff Size: Normal)   Ht 5\' 6"  (1.676 m)   Wt 139 lb (63 kg)   BMI 22.44 kg/m  Physical Exam Gen: NAD, alert, cooperative  with exam, well-appearing MSK:  Left hand: No malrotation or misalignment. Normal strength resistance. Normal range of motion. Neurovascular intact  1. Forearm/hand/wrist 2. Left  3. Ulnar gutter 4. Ortho-glass 5. Applied by Dr.     ASSESSMENT & PLAN:   Cervical strain Reports having pain and weakness of the neck posteriorly.  Her head feels heavy from time to time. -Counseled on home exercise therapy and supportive care. -Cervical soft collar. -Could consider trigger point injections.  Sprain of metacarpophalangeal joint of left little finger Potential for stretch injury of the ulnar nerve versus sprain of the fifth MCP joint. -Counseled on home exercise therapy and supportive care. -Ulnar gutter splint. -Could consider imaging.

## 2021-03-04 NOTE — Assessment & Plan Note (Signed)
Potential for stretch injury of the ulnar nerve versus sprain of the fifth MCP joint. -Counseled on home exercise therapy and supportive care. -Ulnar gutter splint. -Could consider imaging.

## 2021-03-10 ENCOUNTER — Other Ambulatory Visit: Payer: Self-pay | Admitting: *Deleted

## 2021-03-10 DIAGNOSIS — S83001A Unspecified subluxation of right patella, initial encounter: Secondary | ICD-10-CM

## 2021-03-10 DIAGNOSIS — S43001A Unspecified subluxation of right shoulder joint, initial encounter: Secondary | ICD-10-CM

## 2021-03-10 MED ORDER — CELECOXIB 200 MG PO CAPS: ORAL_CAPSULE | ORAL | 1 refills | Status: DC

## 2021-03-18 ENCOUNTER — Other Ambulatory Visit: Payer: Self-pay

## 2021-03-18 ENCOUNTER — Encounter: Payer: Self-pay | Admitting: Family Medicine

## 2021-03-18 ENCOUNTER — Ambulatory Visit (INDEPENDENT_AMBULATORY_CARE_PROVIDER_SITE_OTHER): Payer: Medicaid Other | Admitting: Family Medicine

## 2021-03-18 VITALS — BP 90/62 | Ht 66.0 in | Wt 139.0 lb

## 2021-03-18 DIAGNOSIS — S63657D Sprain of metacarpophalangeal joint of left little finger, subsequent encounter: Secondary | ICD-10-CM

## 2021-03-18 DIAGNOSIS — M2391 Unspecified internal derangement of right knee: Secondary | ICD-10-CM | POA: Insufficient documentation

## 2021-03-18 NOTE — Assessment & Plan Note (Signed)
Has done better with the splint.  - counseled on home exercise therapy and supportive care

## 2021-03-18 NOTE — Patient Instructions (Signed)
Good to see you Please try to eat on more of a schedule and see if that helps with the weakness.  Keep me updated about the knee   Please send me a message in MyChart with any questions or updates.  Please see me back in 2 months.   --Dr. Jordan Likes

## 2021-03-18 NOTE — Progress Notes (Signed)
  Suzanne Fletcher - 30 y.o. female MRN [id number]  Date of birth: 1991-03-03  SUBJECTIVE:  Including CC & ROS.  No chief complaint on file.   Suzanne Fletcher is a 30 y.o. female that is following up for left hand pain and presenting with lower leg weakness and right knee pain.  She reports her hand is doing better since using the splint.  She does endorse weakness in the lower leg and having intermittent locking of her knee.  This occurred last year where her knee was locked in flexion for a good period of time.   Review of Systems See HPI   HISTORY: Past Medical, Surgical, Social, and Family History Reviewed & Updated per EMR.   Pertinent Historical Findings include:  Past Medical History:  Diagnosis Date   Asthma    Blind in both eyes    Migraine    Seizures (HCC)     Past Surgical History:  Procedure Laterality Date   EYE SURGERY      History reviewed. No pertinent family history.  Social History   Socioeconomic History   Marital status: Single    Spouse name: Not on file   Number of children: Not on file   Years of education: Not on file   Highest education level: Not on file  Occupational History   Not on file  Tobacco Use   Smoking status: Never   Smokeless tobacco: Never  Vaping Use   Vaping Use: Never used  Substance and Sexual Activity   Alcohol use: No   Drug use: Not on file   Sexual activity: Not on file  Other Topics Concern   Not on file  Social History Narrative   Not on file   Social Determinants of Health   Financial Resource Strain: Not on file  Food Insecurity: Not on file  Transportation Needs: Not on file  Physical Activity: Not on file  Stress: Not on file  Social Connections: Not on file  Intimate Partner Violence: Not on file     PHYSICAL EXAM:  VS: BP 90/62 (BP Location: Right Arm, Patient Position: Sitting, Cuff Size: Normal)   Ht 5\' 6"  (1.676 m)   Wt 139 lb (63 kg)   BMI 22.44 kg/m  Physical Exam Gen: NAD, alert,  cooperative with exam, well-appearing MSK:  Right knee: No obvious effusion. Some pain with McMurray's test. Neurovascular intact     ASSESSMENT & PLAN:   Sprain of metacarpophalangeal joint of left little finger Has done better with the splint.  - counseled on home exercise therapy and supportive care   Locking of right knee Reports that her knee has been locking intermittently.  She also has weakness of the hip flexor region.  MRI of the right knee from 2018 was reassuring.  Some of the weakness has been more related to hypoglycemia. -Counseled on home exercise therapy and supportive care. -Continue to monitor.

## 2021-03-18 NOTE — Assessment & Plan Note (Signed)
Reports that her knee has been locking intermittently.  She also has weakness of the hip flexor region.  MRI of the right knee from 2018 was reassuring.  Some of the weakness has been more related to hypoglycemia. -Counseled on home exercise therapy and supportive care. -Continue to monitor.

## 2021-05-13 ENCOUNTER — Other Ambulatory Visit: Payer: Self-pay | Admitting: Family Medicine

## 2021-05-13 DIAGNOSIS — S43001A Unspecified subluxation of right shoulder joint, initial encounter: Secondary | ICD-10-CM

## 2021-05-13 DIAGNOSIS — S83001A Unspecified subluxation of right patella, initial encounter: Secondary | ICD-10-CM

## 2021-05-21 ENCOUNTER — Ambulatory Visit (INDEPENDENT_AMBULATORY_CARE_PROVIDER_SITE_OTHER): Payer: Medicaid Other | Admitting: Family Medicine

## 2021-05-21 ENCOUNTER — Other Ambulatory Visit: Payer: Self-pay

## 2021-05-21 ENCOUNTER — Encounter: Payer: Self-pay | Admitting: Family Medicine

## 2021-05-21 VITALS — BP 110/70 | Ht 66.0 in | Wt 139.0 lb

## 2021-05-21 DIAGNOSIS — S43001A Unspecified subluxation of right shoulder joint, initial encounter: Secondary | ICD-10-CM

## 2021-05-21 DIAGNOSIS — G90A Postural orthostatic tachycardia syndrome (POTS): Secondary | ICD-10-CM | POA: Insufficient documentation

## 2021-05-21 DIAGNOSIS — I498 Other specified cardiac arrhythmias: Secondary | ICD-10-CM | POA: Diagnosis not present

## 2021-05-21 DIAGNOSIS — S83001D Unspecified subluxation of right patella, subsequent encounter: Secondary | ICD-10-CM

## 2021-05-21 DIAGNOSIS — S43001D Unspecified subluxation of right shoulder joint, subsequent encounter: Secondary | ICD-10-CM | POA: Diagnosis present

## 2021-05-21 MED ORDER — BACLOFEN 10 MG PO TABS: ORAL_TABLET | ORAL | 3 refills | Status: DC

## 2021-05-21 MED ORDER — CELECOXIB 200 MG PO CAPS: ORAL_CAPSULE | ORAL | 1 refills | Status: DC

## 2021-05-21 NOTE — Patient Instructions (Signed)
Good to see you Please look into the body braid   Please send me a message in MyChart with any questions or updates.  Please see me back in 8 weeks.   --Dr. Jordan Likes

## 2021-05-21 NOTE — Progress Notes (Signed)
  Suzanne Fletcher - 30 y.o. female MRN [id number]  Date of birth: 1991/03/26  SUBJECTIVE:  Including CC & ROS.  No chief complaint on file.   [name] is a 30 y.o. female that is following up for her shoulder pain and knee pain.  She needs refills on her diclofenac and Celebrex.  She does endorse tachycardia when she gets up from a seated position.  Has known history of her mobility has concern for POTS.Marland Kitchen    Review of Systems See HPI   HISTORY: Past Medical, Surgical, Social, and Family History Reviewed & Updated per EMR.   Pertinent Historical Findings include:  Past Medical History:  Diagnosis Date   Asthma    Blind in both eyes    Migraine    Seizures (HCC)     Past Surgical History:  Procedure Laterality Date   EYE SURGERY      History reviewed. No pertinent family history.  Social History   Socioeconomic History   Marital status: Single    Spouse name: Not on file   Number of children: Not on file   Years of education: Not on file   Highest education level: Not on file  Occupational History   Not on file  Tobacco Use   Smoking status: Never   Smokeless tobacco: Never  Vaping Use   Vaping Use: Never used  Substance and Sexual Activity   Alcohol use: No   Drug use: Not on file   Sexual activity: Not on file  Other Topics Concern   Not on file  Social History Narrative   Not on file   Social Determinants of Health   Financial Resource Strain: Not on file  Food Insecurity: Not on file  Transportation Needs: Not on file  Physical Activity: Not on file  Stress: Not on file  Social Connections: Not on file  Intimate Partner Violence: Not on file     PHYSICAL EXAM:  VS: BP 110/70 (BP Location: Left Arm, Patient Position: Sitting, Cuff Size: Normal)   Ht 5\' 6"  (1.676 m)   Wt 139 lb (63 kg)   BMI 22.44 kg/m  Physical Exam Gen: NAD, alert, cooperative with exam, well-appearing       ASSESSMENT & PLAN:   Shoulder subluxation, right,  subsequent encounter Acute on chronic in nature. -Counseled on home exercise therapy and supportive care. -Counseled on body braid.   Subluxation of right patella Acute on chronic in nature. -Counseled on home exercise therapy and supportive care. -Refilled Celebrex. -Refill baclofen.  POTS (postural orthostatic tachycardia syndrome) Reports she has symptoms of lightheadedness and tachycardia when she is getting up from a seated position.  She does have hypermobility and there have been concern for POTS. -Referral to cardiology.

## 2021-05-21 NOTE — Assessment & Plan Note (Signed)
Acute on chronic in nature. -Counseled on home exercise therapy and supportive care. -Counseled on body braid.

## 2021-05-21 NOTE — Assessment & Plan Note (Signed)
Reports she has symptoms of lightheadedness and tachycardia when she is getting up from a seated position.  She does have hypermobility and there have been concern for POTS. -Referral to cardiology.

## 2021-05-21 NOTE — Assessment & Plan Note (Signed)
Acute on chronic in nature. -Counseled on home exercise therapy and supportive care. -Refilled Celebrex. -Refill baclofen.

## 2021-07-08 ENCOUNTER — Institutional Professional Consult (permissible substitution) (HOSPITAL_BASED_OUTPATIENT_CLINIC_OR_DEPARTMENT_OTHER): Payer: Medicaid Other | Admitting: Internal Medicine

## 2021-07-14 ENCOUNTER — Other Ambulatory Visit: Payer: Self-pay | Admitting: Family Medicine

## 2021-07-14 DIAGNOSIS — S83001D Unspecified subluxation of right patella, subsequent encounter: Secondary | ICD-10-CM

## 2021-07-14 DIAGNOSIS — S43001A Unspecified subluxation of right shoulder joint, initial encounter: Secondary | ICD-10-CM

## 2021-07-22 ENCOUNTER — Ambulatory Visit (INDEPENDENT_AMBULATORY_CARE_PROVIDER_SITE_OTHER): Payer: Medicaid Other | Admitting: Family Medicine

## 2021-07-22 ENCOUNTER — Encounter: Payer: Self-pay | Admitting: Family Medicine

## 2021-07-22 DIAGNOSIS — M357 Hypermobility syndrome: Secondary | ICD-10-CM | POA: Diagnosis not present

## 2021-07-22 NOTE — Progress Notes (Signed)
  Suzanne Fletcher - 30 y.o. female MRN [id number]  Date of birth: 23-Nov-1990  SUBJECTIVE:  Including CC & ROS.  No chief complaint on file.   Suzanne Fletcher is a 30 y.o. female that is following up for her hypermobility.  She has been doing well but has yet to be back in physical therapy..   Review of Systems See HPI   HISTORY: Past Medical, Surgical, Social, and Family History Reviewed & Updated per EMR.   Pertinent Historical Findings include:  Past Medical History:  Diagnosis Date   Asthma    Blind in both eyes    Migraine    Seizures (HCC)     Past Surgical History:  Procedure Laterality Date   EYE SURGERY      History reviewed. No pertinent family history.  Social History   Socioeconomic History   Marital status: Single    Spouse name: Not on file   Number of children: Not on file   Years of education: Not on file   Highest education level: Not on file  Occupational History   Not on file  Tobacco Use   Smoking status: Never   Smokeless tobacco: Never  Vaping Use   Vaping Use: Never used  Substance and Sexual Activity   Alcohol use: No   Drug use: Not on file   Sexual activity: Not on file  Other Topics Concern   Not on file  Social History Narrative   Not on file   Social Determinants of Health   Financial Resource Strain: Not on file  Food Insecurity: Not on file  Transportation Needs: Not on file  Physical Activity: Not on file  Stress: Not on file  Social Connections: Not on file  Intimate Partner Violence: Not on file     PHYSICAL EXAM:  VS: BP 120/70 (BP Location: Left Arm, Patient Position: Sitting)   Ht 5\' 6"  (1.676 m)   Wt 139 lb (63 kg)   BMI 22.44 kg/m  Physical Exam Gen: NAD, alert, cooperative with exam, well-appearing      ASSESSMENT & PLAN:   Hypermobility syndrome Having some intermittent back pain and repeated subluxations of the shoulder.  Has not been in physical therapy in a little while. -Counseled on home  exercise therapy and supportive care. -Could consider physical therapy at new location or going back to [facility]

## 2021-07-22 NOTE — Patient Instructions (Signed)
Good to see you  Please let me know if you want to try physical therapy closer to home or go back to [facility] Please send me a message in MyChart with any questions or updates.  Please see me back in 8 weeks.   --Dr. Jordan Likes

## 2021-07-22 NOTE — Assessment & Plan Note (Signed)
Having some intermittent back pain and repeated subluxations of the shoulder.  Has not been in physical therapy in a little while. -Counseled on home exercise therapy and supportive care. -Could consider physical therapy at new location or going back to [facility]

## 2021-07-24 ENCOUNTER — Ambulatory Visit: Payer: Medicaid Other | Attending: Internal Medicine

## 2021-07-24 ENCOUNTER — Ambulatory Visit: Payer: Medicaid Other

## 2021-07-24 NOTE — Progress Notes (Unsigned)
   Covid-19 Vaccination Clinic  Name:  Suzanne Fletcher    MRN: [id number] DOB: June 02, 1991  07/24/2021  [name] was observed post Covid-19 immunization for 15 minutes without incident. She was provided with Vaccine Information Sheet and instruction to access the V-Safe system.   [name] was instructed to call 911 with any severe reactions post vaccine: Difficulty breathing  Swelling of face and throat  A fast heartbeat  A bad rash all over body  Dizziness and weakness    *** Covid vaccine administration is NOT RECORDED.  Must document administration and refresh note before signing ***

## 2021-07-24 NOTE — Progress Notes (Signed)
   Covid-19 Vaccination Clinic  Name:  Suzanne Fletcher    MRN: [id number] DOB: January 19, 1991  07/24/2021  [name] was observed post Covid-19 immunization for 15 minutes without incident. She was provided with Vaccine Information Sheet and instruction to access the V-Safe system.   [name] was instructed to call 911 with any severe reactions post vaccine: Difficulty breathing  Swelling of face and throat  A fast heartbeat  A bad rash all over body  Dizziness and weakness

## 2021-08-07 ENCOUNTER — Encounter: Payer: Self-pay | Admitting: Cardiology

## 2021-08-07 ENCOUNTER — Ambulatory Visit: Payer: Medicaid Other | Admitting: Cardiology

## 2021-08-07 ENCOUNTER — Other Ambulatory Visit: Payer: Self-pay

## 2021-08-07 VITALS — BP 116/77 | HR 118 | Ht 66.0 in | Wt 148.0 lb

## 2021-08-07 DIAGNOSIS — R000 Tachycardia, unspecified: Secondary | ICD-10-CM

## 2021-08-07 DIAGNOSIS — G901 Familial dysautonomia [Riley-Day]: Secondary | ICD-10-CM

## 2021-08-07 MED ORDER — METOPROLOL SUCCINATE ER 25 MG PO TB24: 25.0000 mg | ORAL_TABLET | Freq: Every day | ORAL | 3 refills | Status: DC

## 2021-08-07 NOTE — Patient Instructions (Addendum)
Medication Instructions:  Your physician has recommended you make the following change in your medication:    You will start taking metoprolol succinate 25 mg-  Take one tablet by mouth daily  Lab Work: None ordered. If you have labs (blood work) drawn today and your tests are completely normal, you will receive your results only by: MyChart Message (if you have MyChart) OR A paper copy in the mail If you have any lab test that is abnormal or we need to change your treatment, we will call you to review the results.  Testing/Procedures: None ordered.  Follow-Up: At [facility], you and your health needs are our priority.  As part of our continuing mission to provide you with exceptional heart care, we have created designated Provider Care Teams.  These Care Teams include your primary Cardiologist (physician) and Advanced Practice Providers (APPs -  Physician Assistants and Nurse Practitioners) who all work together to provide you with the care you need, when you need it.  Your next appointment:   Your physician wants you to follow-up in: 6-8 weeks with one of the following Advanced Practice Providers on your designated Care Team:   Francis Dowse, [place] Casimiro Needle "Mardelle Matte" [place], [place]  Metoprolol Extended-Release Capsules What is this medication? METOPROLOL (me TOE proe lole) treats high blood pressure and heart failure. It may also be used to prevent chest pain (angina). It works by lowering your blood pressure and heart rate, making it easier for your heart to pump blood to the rest of your body. It belongs to a group of medications called beta blockers. This medicine may be used for other purposes; ask your health care provider or pharmacist if you have questions. COMMON BRAND NAME(S): [facility] What should I tell my care team before I take this medication? They need to know if you have any of these conditions: Diabetes Heart disease Liver disease Lung or breathing disease, like  asthma Pheochromocytoma Thyroid disease An unusual or allergic reaction to metoprolol, other beta blockers, drugs, foods, dyes, or preservatives Pregnant or trying to get pregnant Breast-feeding How should I use this medication? Take this medication by mouth with water. Take it as directed on the prescription label at the same time every day. Do not cut, crush, or chew this medication. Swallow the capsules whole. You may open the capsule and put the contents in 1 teaspoon of applesauce. Swallow the medication and applesauce right away. Do not chew the medication or applesauce. Keep taking it unless your care team tells you to stop. Talk to your care team about the use of this medication in children. While it may be prescribed for children as young as 6 years for selected conditions, precautions do apply. Overdosage: If you think you have taken too much of this medicine contact a poison control center or emergency room at once. NOTE: This medicine is only for you. Do not share this medicine with others. What if I miss a dose? If you miss a dose, take it as soon as you can. If it is almost time for your next dose, take only that dose. Do not take double or extra doses. What may interact with this medication? This medication may interact with the following: Certain medications for blood pressure, heart disease, irregular heartbeat Epinephrine Fluoxetine MAOIs like Carbex, Eldepryl, Marplan, Nardil, and Parnate Paroxetine Reserpine This list may not describe all possible interactions. Give your health care provider a list of all the medicines, herbs, non-prescription drugs, or dietary supplements you use.  Also tell them if you smoke, drink alcohol, or use illegal drugs. Some items may interact with your medicine. What should I watch for while using this medication? Visit your care team for regular checks on your progress. Check your blood pressure as directed. Ask your care team what your blood  pressure should be. Also, find out when you should contact them. Do not treat yourself for coughs, colds, or pain while you are using this medication without asking your care team for advice. Some medications may increase your blood pressure. You may get drowsy or dizzy. Do not drive, use machinery, or do anything that needs mental alertness until you know how this medication affects you. Do not stand up or sit up quickly, especially if you are an older patient. This reduces the risk of dizzy or fainting spells. Alcohol may interfere with the effect of this medication. Avoid alcoholic drinks. This medication may increase blood sugar. Ask your care team if changes in diet or medications are needed if you have diabetes. What side effects may I notice from receiving this medication? Side effects that you should report to your care team as soon as possible: Allergic reactions--skin rash, itching, hives, swelling of the face, lips, tongue, or throat Heart failure--shortness of breath, swelling of the ankles, feet, or hands, sudden weight gain, unusual weakness or fatigue Low blood pressure--dizziness, feeling faint or lightheaded, blurry vision Raynaud's--cool, numb, or painful fingers or toes that may change color from pale, to blue, to red Slow heartbeat--dizziness, feeling faint or lightheaded, confusion, trouble breathing, unusual weakness or fatigue Worsening mood, feelings of depression Side effects that usually do not require medical attention (report to your care team if they continue or are bothersome): Change in sex drive or performance Diarrhea Dizziness Fatigue Headache This list may not describe all possible side effects. Call your doctor for medical advice about side effects. You may report side effects to FDA at 1-800-FDA-1088. Where should I keep my medication? Keep out of the reach of children and pets. Store at room temperature between 20 and 25 degrees C (68 and 77 degrees F).  Throw away any unused medication after the expiration date. NOTE: This sheet is a summary. It may not cover all possible information. If you have questions about this medicine, talk to your doctor, pharmacist, or health care provider.  2022 Elsevier/Gold Standard (2021-06-09 00:00:00)

## 2021-08-07 NOTE — Progress Notes (Signed)
Electrophysiology Office Note:    Date:  08/07/2021   ID:  Suzanne Fletcher, DOB 11/10/1990, MRN [id number]  PCP:  Lupita Raider, MD  [facility] Cardiologist:  None  [facility] Electrophysiologist:  Lanier Prude, MD   Referring MD: Myra Rude, MD   Chief Complaint: Tachycardia  History of Present Illness:    Suzanne Fletcher is a 30 y.o. female who presents for an evaluation of tachycardia at the request of Dr. Jordan Likes. Their medical history includes asthma, blindness, migraine, seizures and hypermobility. She tells me that she has never had a syncopal episode but will get lightheaded and dizzy at times.  The tachycardia seems to be present all the time but there are periods where it will be worse.  He tries to stay adequately hydrated.  With minimal exertion her heart rate will go up significantly. She has rare seizures.  She seen at [facility] neurology. She said she lost her vision when she was in the sixth grade suddenly.     Past Medical History:  Diagnosis Date   Asthma    Blind in both eyes    Migraine    Seizures (HCC)     Past Surgical History:  Procedure Laterality Date   EYE SURGERY      Current Medications: Current Meds  Medication Sig   albuterol (PROVENTIL HFA;VENTOLIN HFA) 108 (90 Base) MCG/ACT inhaler Inhale into the lungs.   baclofen (LIORESAL) 10 MG tablet TAKE 1/2 TO 1 TABLET(5 TO 10 MG) BY MOUTH THREE TIMES DAILY AS NEEDED FOR MUSCLE SPASMS   budesonide-formoterol (SYMBICORT) 80-4.5 MCG/ACT inhaler    celecoxib (CELEBREX) 200 MG capsule TAKE 1 TO 2 CAPSULES BY MOUTH DAILY AS NEEDED FOR PAIN   Eptinezumab-jjmr (VYEPTI IV) Inject into the vein.   fluticasone (FLONASE) 50 MCG/ACT nasal spray Place into both nostrils.   HYDROcodone-acetaminophen (NORCO/VICODIN) 5-325 MG tablet Take 1 tablet by mouth every 6 (six) hours as needed for moderate pain.   HYDROcodone-Acetaminophen 5-300 MG TABS Take 1 tablet by mouth daily as needed.    hydrocortisone 2.5 % cream Apply topically 2 (two) times daily.   levETIRAcetam (KEPPRA) 750 MG tablet Take by mouth.   levocetirizine (XYZAL) 5 MG tablet Take by mouth.   metoprolol succinate (TOPROL XL) 25 MG 24 hr tablet Take 1 tablet (25 mg total) by mouth daily.   montelukast (SINGULAIR) 10 MG tablet Take by mouth.   nortriptyline (PAMELOR) 10 MG capsule Take 10 mg by mouth 2 (two) times daily.   ondansetron (ZOFRAN) 4 MG tablet Take 4 mg by mouth 3 (three) times daily as needed.   predniSONE (DELTASONE) 5 MG tablet Take 6 pills for first day, 5 pills second day, 4 pills third day, 3 pills fourth day, 2 pills the fifth day, and 1 pill sixth day.     Allergies:   Ampicillin, Cefadroxil, Cefixime, Cephalosporins, Penicillins, Sulfa antibiotics, and Sulfasalazine   Social History   Socioeconomic History   Marital status: Single    Spouse name: Not on file   Number of children: Not on file   Years of education: Not on file   Highest education level: Not on file  Occupational History   Not on file  Tobacco Use   Smoking status: Never   Smokeless tobacco: Never  Vaping Use   Vaping Use: Never used  Substance and Sexual Activity   Alcohol use: No   Drug use: Not on file   Sexual activity: Not on file  Other Topics Concern   Not on file  Social History Narrative   Not on file   Social Determinants of Health   Financial Resource Strain: Not on file  Food Insecurity: Not on file  Transportation Needs: Not on file  Physical Activity: Not on file  Stress: Not on file  Social Connections: Not on file     Family History: The patient's family history is not on file.  ROS:   Please see the history of present illness.    All other systems reviewed and are negative.  EKGs/Labs/Other Studies Reviewed:    The following studies were reviewed today:  March 27, 2019 CT PE protocol No thoracic aortic aneurysm or dissection  March 28, 2019 EKG shows sinus tachycardia and an  incomplete right bundle branch block.  EKG:  The ekg ordered today demonstrates sinus tachycardia with a ventricular to 118 bpm.    Recent Labs: No results found for requested labs within last 8760 hours.  Recent Lipid Panel No results found for: CHOL, TRIG, HDL, CHOLHDL, VLDL, LDLCALC, LDLDIRECT  Physical Exam:    VS:  BP 116/77   Pulse (!) 118   Ht 5\' 6"  (1.676 m)   Wt 148 lb (67.1 kg)   BMI 23.89 kg/m     Wt Readings from Last 3 Encounters:  08/07/21 148 lb (67.1 kg)  07/22/21 139 lb (63 kg)  05/21/21 139 lb (63 kg)    Orthostatic vitals Flat 122/82, 127 Sitting 111/79, 139 Standing 126/80, 162  GEN:  Well nourished, well developed in no acute distress HEENT: Normal NECK: No JVD; No carotid bruits LYMPHATICS: No lymphadenopathy CARDIAC: Tachycardic, regular rhythm, no murmurs, rubs, gallops RESPIRATORY:  Clear to auscultation without rales, wheezing or rhonchi  ABDOMEN: Soft, non-tender, non-distended MUSCULOSKELETAL:  No edema; No deformity  SKIN: Warm and dry NEUROLOGIC:  Alert and oriented x 3 PSYCHIATRIC:  Normal affect       ASSESSMENT:    1. Tachycardia    PLAN:    In order of problems listed above:  #Tachycardia Likely an element of dysautonomia.  Highly suspicious that there is a unifying diagnosis between her seizures, migraines and significant tachycardia. We discussed medical therapy for resting tachycardia during today's visit including beta-blockers and ivabradine.  I would prefer to start with a low-dose metoprolol and see how she tolerates this medication.  If she is intolerant to the beta-blockers, could consider ivabradine. We also discussed behavioral changes including liberalizing salt intake, maintaining adequate hydration and using thigh length compression stockings with an abdominal binder that the patient already uses. Recommend continued neurology follow-up with [facility].  Follow-up with an APP in 6 to 8 weeks.  If patient is tolerating  the beta-blockers, could consider further up titration of the dose.  If she does not tolerate the beta-blockers, could consider transition to ivabradine.   Medication Adjustments/Labs and Tests Ordered: Current medicines are reviewed at length with the patient today.  Concerns regarding medicines are outlined above.  Orders Placed This Encounter  Procedures   EKG 12-Lead   Meds ordered this encounter  Medications   metoprolol succinate (TOPROL XL) 25 MG 24 hr tablet    Sig: Take 1 tablet (25 mg total) by mouth daily.    Dispense:  90 tablet    Refill:  3     Signed, 05/23/21 T. Sheria Lang, MD, [facility], [facility] 08/07/2021 11:54 AM    Electrophysiology [facility]

## 2021-08-21 ENCOUNTER — Other Ambulatory Visit (HOSPITAL_BASED_OUTPATIENT_CLINIC_OR_DEPARTMENT_OTHER): Payer: Self-pay

## 2021-08-21 MED ORDER — PFIZER COVID-19 VAC BIVALENT 30 MCG/0.3ML IM SUSP
INTRAMUSCULAR | 0 refills | Status: DC
  Filled 2021-08-21: qty 0.3, 1d supply, fill #0

## 2021-09-01 ENCOUNTER — Ambulatory Visit (INDEPENDENT_AMBULATORY_CARE_PROVIDER_SITE_OTHER): Payer: Medicaid Other | Admitting: Family Medicine

## 2021-09-01 ENCOUNTER — Encounter: Payer: Self-pay | Admitting: Family Medicine

## 2021-09-01 VITALS — BP 110/78 | Ht 66.0 in | Wt 148.0 lb

## 2021-09-01 DIAGNOSIS — S83001A Unspecified subluxation of right patella, initial encounter: Secondary | ICD-10-CM

## 2021-09-01 NOTE — Progress Notes (Signed)
  Suzanne Fletcher - 30 y.o. female MRN [id number]  Date of birth: December 18, 1990  SUBJECTIVE:  Including CC & ROS.  No chief complaint on file.   Suzanne Fletcher is a 30 y.o. female that is presenting with acute right knee pain.  She felt like she had a hyperextension injury.  Since that time she has had mechanical symptoms of the knee.  Has had symptoms in the past that are similar.   Review of Systems See HPI   HISTORY: Past Medical, Surgical, Social, and Family History Reviewed & Updated per EMR.   Pertinent Historical Findings include:  Past Medical History:  Diagnosis Date   Asthma    Blind in both eyes    Migraine    Seizures (HCC)     Past Surgical History:  Procedure Laterality Date   EYE SURGERY      History reviewed. No pertinent family history.  Social History   Socioeconomic History   Marital status: Single    Spouse name: Not on file   Number of children: Not on file   Years of education: Not on file   Highest education level: Not on file  Occupational History   Not on file  Tobacco Use   Smoking status: Never   Smokeless tobacco: Never  Vaping Use   Vaping Use: Never used  Substance and Sexual Activity   Alcohol use: No   Drug use: Not on file   Sexual activity: Not on file  Other Topics Concern   Not on file  Social History Narrative   Not on file   Social Determinants of Health   Financial Resource Strain: Not on file  Food Insecurity: Not on file  Transportation Needs: Not on file  Physical Activity: Not on file  Stress: Not on file  Social Connections: Not on file  Intimate Partner Violence: Not on file     PHYSICAL EXAM:  VS: BP 110/78 (BP Location: Left Arm, Patient Position: Sitting)   Ht 5\' 6"  (1.676 m)   Wt 148 lb (67.1 kg)   BMI 23.89 kg/m  Physical Exam Gen: NAD, alert, cooperative with exam, well-appearing    ASSESSMENT & PLAN:   Patellar subluxation, right, 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  No  effusion on exam today. -Counseled on home exercise therapy and supportive care. -Referral to physical therapy. -Could consider further imaging.

## 2021-09-01 NOTE — Patient Instructions (Signed)
Good to see you Please try physical therapy   Please send me a message in MyChart with any questions or updates.  Please see me back in 4-8 weeks.   --Dr. Jordan Likes

## 2021-09-01 NOTE — Assessment & Plan Note (Signed)
Acute on chronic in nature.  No effusion on exam today. -Counseled on home exercise therapy and supportive care. -Referral to physical therapy. -Could consider further imaging.

## 2021-09-07 ENCOUNTER — Ambulatory Visit (HOSPITAL_BASED_OUTPATIENT_CLINIC_OR_DEPARTMENT_OTHER)
Admission: RE | Admit: 2021-09-07 | Discharge: 2021-09-07 | Disposition: A | Discharge status: Home / Self Care | Payer: Medicaid Other | Source: Ambulatory Visit | Attending: Family Medicine | Admitting: Family Medicine

## 2021-09-07 ENCOUNTER — Ambulatory Visit (INDEPENDENT_AMBULATORY_CARE_PROVIDER_SITE_OTHER): Payer: Medicaid Other | Admitting: Family Medicine

## 2021-09-07 ENCOUNTER — Other Ambulatory Visit: Payer: Self-pay

## 2021-09-07 ENCOUNTER — Ambulatory Visit: Payer: Self-pay

## 2021-09-07 ENCOUNTER — Encounter: Payer: Self-pay | Admitting: Family Medicine

## 2021-09-07 DIAGNOSIS — S83001D Unspecified subluxation of right patella, subsequent encounter: Secondary | ICD-10-CM | POA: Diagnosis not present

## 2021-09-07 NOTE — Patient Instructions (Signed)
Good to see you I will call with the results.   Please send me a message in MyChart with any questions or updates.  Please see me back as scheduled.   --Dr. Jordan Likes

## 2021-09-07 NOTE — Progress Notes (Signed)
  Suzanne Fletcher - 30 y.o. female MRN [id number]  Date of birth: [birth year]  SUBJECTIVE:  Including CC & ROS.  No chief complaint on file.   Suzanne Fletcher is a 30 y.o. female that is presenting with concern of her right knee pain.   Review of Systems See HPI   HISTORY: Past Medical, Surgical, Social, and Family History Reviewed & Updated per EMR.   Pertinent Historical Findings include:  Past Medical History:  Diagnosis Date   Asthma    Blind in both eyes    Migraine    Seizures (HCC)     Past Surgical History:  Procedure Laterality Date   EYE SURGERY      History reviewed. No pertinent family history.  Social History   Socioeconomic History   Marital status: Single    Spouse name: Not on file   Number of children: Not on file   Years of education: Not on file   Highest education level: Not on file  Occupational History   Not on file  Tobacco Use   Smoking status: Never   Smokeless tobacco: Never  Vaping Use   Vaping Use: Never used  Substance and Sexual Activity   Alcohol use: No   Drug use: Not on file   Sexual activity: Not on file  Other Topics Concern   Not on file  Social History Narrative   Not on file   Social Determinants of Health   Financial Resource Strain: Not on file  Food Insecurity: Not on file  Transportation Needs: Not on file  Physical Activity: Not on file  Stress: Not on file  Social Connections: Not on file  Intimate Partner Violence: Not on file     PHYSICAL EXAM:  VS: BP 100/72 (BP Location: Right Arm, Patient Position: Sitting)   Ht 5\' 6"  (1.676 m)   Wt 148 lb (67.1 kg)   BMI 23.89 kg/m  Physical Exam Gen: NAD, alert, cooperative with exam, well-appearing   Limited ultrasound: Right knee:  No effusion suprapatellar pouch. Normal-appearing quadricep and patellar tendon. Normal-appearing medial and lateral joint space. No changes of the retinaculum  Summary: No structural changes appreciated  Ultrasound and  interpretation by , MD     ASSESSMENT & PLAN:   Patellar subluxation, right, subsequent encounter Ongoing pain in no structural changes appreciated ultrasound was done today. -Counseled on home exercise therapy and supportive care. -X-ray. -Continue physical therapy.

## 2021-09-07 NOTE — Assessment & Plan Note (Addendum)
Ongoing pain in no structural changes appreciated ultrasound was done today. -Counseled on home exercise therapy and supportive care. -X-ray. -Continue physical therapy.

## 2021-09-08 ENCOUNTER — Telehealth: Payer: Self-pay | Admitting: Family Medicine

## 2021-09-08 NOTE — Telephone Encounter (Signed)
Informed of results.   Myra Rude, MD [facility] 09/08/2021, 9:19 AM

## 2021-09-21 ENCOUNTER — Other Ambulatory Visit: Payer: Self-pay | Admitting: Family Medicine

## 2021-09-21 DIAGNOSIS — S83001D Unspecified subluxation of right patella, subsequent encounter: Secondary | ICD-10-CM

## 2021-09-21 DIAGNOSIS — S43001A Unspecified subluxation of right shoulder joint, initial encounter: Secondary | ICD-10-CM

## 2021-10-01 ENCOUNTER — Telehealth: Payer: Self-pay | Admitting: Physician Assistant

## 2021-10-01 NOTE — Progress Notes (Signed)
Virtual Visit via Telephone Note   Today's visit was changed to virtual/telephone visit at the request of the patient/her mother  This visit type was conducted due to national recommendations for restrictions regarding the COVID-19 Pandemic (e.g. social distancing) in an effort to limit this patient's exposure and mitigate transmission in our community.  Due to her co-morbid illnesses, this patient is at least at moderate risk for complications without adequate follow up.  This format is felt to be most appropriate for this patient at this time.  The patient did not have access to video technology/had technical difficulties with video requiring transitioning to audio format only (telephone).  All issues noted in this document were discussed and addressed.  No physical exam could be performed with this format.  Please refer to the patient's chart for her  consent to telehealth for [facility].  The patient was identified using 2 identifiers.         Cardiology Office Note Date:  10/02/2021  Patient ID:  Suzanne Fletcher, Suzanne Fletcher 04/21/1991, MRN [id number] PCP:  Lupita Raider, MD  Electrophysiologist: Dr. Lalla Brothers    Chief Complaint: planned f/u  History of Present Illness: Suzanne Fletcher is a 30 y.o. female with history of asthma, migraines, blindness (since elementary age), seizure d/o, hypermobility  She was referred to Dr. Lalla Brothers for tachycardia, last seen by him 08/07/21.  Reportedly she has some degree of fast HRs at all times, though at times and with minimal exertion rapidly rises.  Some associated/episodes of dizziness, no syncope. Suspected there was some unknown underlying/unifying diagnosis with seizures, migraines, tachycardia.  Suspected a component dysautonomia planned to start low dose BB, discussed Ivabradine was an option if intolerant to BB. Discussed salt liberalization, hydration, compression wear and continued f/u with her [facility] Neurologist  The patient  requested a telephone visit vs video identified herself by name and birthdate She requested telephone visit today 2/2 a recent increase in Migraine HA's, did not want to come in. She reports that generally though she is active, rides and cares for her horses, is pretty labor intensive at times. She feels like the addition of metoprolol has improved her symptoms significantly She monitors her HR via her Apple Watch, resting generally 90's and with standing/exertion to the 120 range She has some dizziness with the elevated rates/standing, but this is much improved She has never had syncope  She denies any other symptoms or concerns, she would be inclined to ry a higher Toprol dose given her improvement in symptoms She does not check her BP  Past Medical History:  Diagnosis Date   Asthma    Blind in both eyes    Migraine    Seizures (HCC)     Past Surgical History:  Procedure Laterality Date   EYE SURGERY      Current Outpatient Medications  Medication Sig Dispense Refill   albuterol (PROVENTIL HFA;VENTOLIN HFA) 108 (90 Base) MCG/ACT inhaler Inhale into the lungs as needed.     baclofen (LIORESAL) 10 MG tablet TAKE 1/2 TO 1 TABLET(5 TO 10 MG) BY MOUTH THREE TIMES DAILY AS NEEDED FOR MUSCLE SPASMS 30 tablet 3   budesonide-formoterol (SYMBICORT) 80-4.5 MCG/ACT inhaler as needed.     celecoxib (CELEBREX) 200 MG capsule TAKE 1 TO 2 CAPSULES BY MOUTH DAILY AS NEEDED FOR PAIN 60 capsule 1   Eptinezumab-jjmr (VYEPTI IV) Inject into the vein.     Ferrous Sulfate (IRON) 325 (65 Fe) MG TABS daily.  fluticasone (FLONASE) 50 MCG/ACT nasal spray Place into both nostrils.     HYDROcodone-acetaminophen (NORCO/VICODIN) 5-325 MG tablet Take 1 tablet by mouth every 6 (six) hours as needed for moderate pain. 20 tablet 0   hydrocortisone 2.5 % cream Apply topically 2 (two) times daily.     levETIRAcetam (KEPPRA) 750 MG tablet Take by mouth.     levocetirizine (XYZAL) 5 MG tablet Take by mouth.      metoprolol succinate (TOPROL XL) 25 MG 24 hr tablet Take 1 tablet (25 mg total) by mouth daily. 90 tablet 3   montelukast (SINGULAIR) 10 MG tablet Take by mouth.     nortriptyline (PAMELOR) 10 MG capsule Take 10 mg by mouth 2 (two) times daily.     Omega-3 Fatty Acids (FISH OIL) 1000 MG CAPS daily.     omeprazole (PRILOSEC) 20 MG capsule Take 20 mg by mouth daily.     ondansetron (ZOFRAN) 4 MG tablet Take 4 mg by mouth 3 (three) times daily as needed.     No current facility-administered medications for this visit.    Allergies:   Ampicillin, Cefadroxil, Cefixime, Cephalosporins, Penicillins, Sulfa antibiotics, and Sulfasalazine   Social History:  The patient  reports that she has never smoked. She has never used smokeless tobacco. She reports that she does not drink alcohol.   Family History:  The patient's family history is not on file.  ROS:  Please see the history of present illness.    All other systems are reviewed and otherwise negative.   PHYSICAL EXAM:  VS:  Pulse (!) 120    Ht 5\' 6"  (1.676 m)    Wt 140 lb (63.5 kg)    BMI 22.60 kg/m  BMI: Body mass index is 22.6 kg/m.  She verbalizes feeling well, speaks in a normal tone, steady paced, does not sound SOB or in any distress   EKG:  Done 08/17/21 personally reviewed ST 118bpm   Recent Labs: No results found for requested labs within last 8760 hours.  No results found for requested labs within last 8760 hours.   CrCl cannot be calculated (Patient's most recent lab result is older than the maximum 21 days allowed.).   Wt Readings from Last 3 Encounters:  10/02/21 140 lb (63.5 kg)  09/07/21 148 lb (67.1 kg)  09/01/21 148 lb (67.1 kg)     Other studies reviewed: Additional studies/records reviewed today include: summarized above  ASSESSMENT AND PLAN:  Tachycardia Suspect dysautonomia She has had much improvement in her symptoms, though still gets some spkes in HR with standing and exertion. No reports of HRs <  90 range. She would like to try an increase in the metoprolol Will increase Toprol to 50mg  daily, she will monitor HRs, symptoms  Time spent 11 minutes  Disposition: F/u with 09/03/21 in 67mo, sooner if needed  Current medicines are reviewed at length with the patient today.  The patient did not have any concerns regarding medicines.  [place], PA-C 10/02/2021 11:00 AM     [facility] [address] [place] [phone] (office)  [phone] (fax)

## 2021-10-01 NOTE — Telephone Encounter (Signed)
Patient's mother calling to request patient's appointment tomorrow be virtual. She says they thought the appointment was virtual and it would be easier that way.

## 2021-10-02 ENCOUNTER — Ambulatory Visit (INDEPENDENT_AMBULATORY_CARE_PROVIDER_SITE_OTHER): Payer: Medicaid Other | Admitting: Physician Assistant

## 2021-10-02 VITALS — HR 120 | Ht 66.0 in | Wt 140.0 lb

## 2021-10-02 DIAGNOSIS — R000 Tachycardia, unspecified: Secondary | ICD-10-CM

## 2021-10-02 DIAGNOSIS — R42 Dizziness and giddiness: Secondary | ICD-10-CM

## 2021-10-02 MED ORDER — METOPROLOL SUCCINATE ER 50 MG PO TB24: 50.0000 mg | ORAL_TABLET | Freq: Every day | ORAL | 1 refills | Status: DC

## 2021-10-02 NOTE — Patient Instructions (Addendum)
Medication Instructions:   START TAKING TOPROL  XL 50 MG ONCE A DAY    *If you need a refill on your cardiac medications before your next appointment, please call your pharmacy*   Lab Work: NONE ORDERED  TODAY   If you have labs (blood work) drawn today and your tests are completely normal, you will receive your results only by: MyChart Message (if you have MyChart) OR A paper copy in the mail If you have any lab test that is abnormal or we need to change your treatment, we will call you to review the results.   Testing/Procedures:  NONE ORDERED  TODAY    Follow-Up: At [facility], you and your health needs are our priority.  As part of our continuing mission to provide you with exceptional heart care, we have created designated Provider Care Teams.  These Care Teams include your primary Cardiologist (physician) and Advanced Practice Providers (APPs -  Physician Assistants and Nurse Practitioners) who all work together to provide you with the care you need, when you need it.  We recommend signing up for the patient portal called "MyChart".  Sign up information is provided on this After Visit Summary.  MyChart is used to connect with patients for Virtual Visits (Telemedicine).  Patients are able to view lab/test results, encounter notes, upcoming appointments, etc.  Non-urgent messages can be sent to your provider as well.   To learn more about what you can do with MyChart, go to [email or web address].    Your next appointment:   2 month(s)  The format for your next appointment:   In Person  Provider:   Steffanie Dunn, MD /APP    Other Instructions

## 2021-10-08 ENCOUNTER — Ambulatory Visit: Payer: Medicaid Other | Admitting: Family Medicine

## 2021-10-08 ENCOUNTER — Encounter: Payer: Self-pay | Admitting: Family Medicine

## 2021-10-08 VITALS — BP 100/70 | Ht 66.0 in | Wt 140.0 lb

## 2021-10-08 DIAGNOSIS — S161XXA Strain of muscle, fascia and tendon at neck level, initial encounter: Secondary | ICD-10-CM | POA: Diagnosis not present
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
## 2021-10-08 NOTE — Patient Instructions (Signed)
Good to see you Please try heat  Please let me know if you want to get an xray  Please send me a message in MyChart with any questions or updates.  Please see me back in 2-3 months.   --Dr. Jordan Likes

## 2021-11-22 ENCOUNTER — Other Ambulatory Visit: Payer: Self-pay | Admitting: Family Medicine

## 2021-11-22 DIAGNOSIS — S43001A Unspecified subluxation of right shoulder joint, initial encounter: Secondary | ICD-10-CM

## 2021-11-22 DIAGNOSIS — S83001D Unspecified subluxation of right patella, subsequent encounter: Secondary | ICD-10-CM

## 2021-11-30 ENCOUNTER — Ambulatory Visit: Payer: Medicaid Other | Admitting: Cardiology

## 2021-12-07 ENCOUNTER — Ambulatory Visit: Payer: Medicaid Other | Admitting: Family Medicine

## 2021-12-16 ENCOUNTER — Encounter: Payer: Self-pay | Admitting: Family Medicine

## 2021-12-16 ENCOUNTER — Ambulatory Visit (INDEPENDENT_AMBULATORY_CARE_PROVIDER_SITE_OTHER): Payer: Medicaid Other | Admitting: Family Medicine

## 2021-12-16 VITALS — BP 90/64 | Ht 66.0 in | Wt 140.0 lb

## 2021-12-16 DIAGNOSIS — R2689 Other abnormalities of gait and mobility: Secondary | ICD-10-CM | POA: Insufficient documentation

## 2021-12-16 DIAGNOSIS — S43001A Unspecified subluxation of right shoulder joint, initial encounter: Secondary | ICD-10-CM

## 2021-12-16 DIAGNOSIS — S83001D Unspecified subluxation of right patella, subsequent encounter: Secondary | ICD-10-CM

## 2021-12-16 DIAGNOSIS — M21371 Foot drop, right foot: Secondary | ICD-10-CM

## 2021-12-16 DIAGNOSIS — M546 Pain in thoracic spine: Secondary | ICD-10-CM | POA: Insufficient documentation

## 2021-12-16 MED ORDER — CELECOXIB 200 MG PO CAPS: 200.0000 mg | ORAL_CAPSULE | Freq: Two times a day (BID) | ORAL | 1 refills | Status: DC | PRN

## 2021-12-16 MED ORDER — BACLOFEN 10 MG PO TABS: ORAL_TABLET | ORAL | 3 refills | Status: AC

## 2021-12-16 NOTE — Assessment & Plan Note (Signed)
Acute on chronic in nature.  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 ?-Counseled on home exercise therapy and supportive care. ?-Provided prescription for new ankle-foot arthrosis. ?

## 2021-12-16 NOTE — Assessment & Plan Note (Addendum)
Acute on chronic in nature.  Likely having spasm in the midline as to why the pain is intermittent at different levels. ?-Counseled on home exercise therapy and supportive care. ?-Counseled on body braid. ?-Baclofen ?-Could consider physical therapy or further imaging.  ?

## 2021-12-16 NOTE — Assessment & Plan Note (Signed)
Currently stable. ?-refilled Celebrex. ?

## 2021-12-16 NOTE — Progress Notes (Signed)
?  Suzanne Fletcher - 31 y.o. female MRN [id number]  Date of birth: 12/04/1990 ? ?SUBJECTIVE:  Including CC & ROS.  ?No chief complaint on file. ? ? ?Suzanne Fletcher is a 31 y.o. female that is presenting with acute on chronic low and mid back pain, right foot drop and giving way of her legs.  The low back and mid back pain is acute on chronic in nature.  Occurs intermittently.  Her legs give out from time to time. ? ? ?Review of Systems ?See HPI  ? ?HISTORY: Past Medical, Surgical, Social, and Family History Reviewed & Updated per EMR.   ?Pertinent Historical Findings include: ? ?Past Medical History:  ?Diagnosis Date  ? Asthma   ? Blind in both eyes   ? Migraine   ? Seizures (HCC)   ? ? ?Past Surgical History:  ?Procedure Laterality Date  ? EYE SURGERY    ? ? ? ?PHYSICAL EXAM:  ?VS: BP 90/64 (BP Location: Left Arm, Patient Position: Sitting)   Ht 5\' 6"  (1.676 m)   Wt 140 lb (63.5 kg)   BMI 22.60 kg/m?  ?Physical Exam ?Gen: NAD, alert, cooperative with exam, well-appearing ?MSK:  ?Neurovascularly intact   ? ? ? ? ?ASSESSMENT & PLAN:  ? ?Right foot drop ?Acute on chronic in nature.  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 ?-Counseled on home exercise therapy and supportive care. ?-Provided prescription for new ankle-foot arthrosis. ? ?Acute midline thoracic back pain ?Acute on chronic in nature.  Likely having spasm in the midline as to why the pain is intermittent at different levels. ?-Counseled on home exercise therapy and supportive care. ?-Counseled on body braid. ?-Baclofen ?-Could consider physical therapy or further imaging.  ? ?Keeps losing balance ?Acutely occurring.  Seems to be when she is walking and her legs give way. ?-Counseled on trying isometric contractions and she feels it may be occurring. ?-Could consider nerve study. ? ?Subluxation of right patella ?Currently stable. ?-refilled Celebrex. ? ? ? ? ?

## 2021-12-16 NOTE — Patient Instructions (Signed)
Good to see you ?Please try the isometric pushing when you feel like your legs are going to give out.  ?Please try the body braid   ?Please send me a message in MyChart with any questions or updates.  ?Please see me back in 2-3 months.  ? ?--Dr. Jordan Likes ? ?

## 2021-12-16 NOTE — Assessment & Plan Note (Signed)
Acutely occurring.  Seems to be when she is walking and her legs give way. ?-Counseled on trying isometric contractions and she feels it may be occurring. ?-Could consider nerve study. ?

## 2022-01-25 ENCOUNTER — Ambulatory Visit (INDEPENDENT_AMBULATORY_CARE_PROVIDER_SITE_OTHER): Payer: Medicaid Other | Admitting: Family Medicine

## 2022-01-25 ENCOUNTER — Ambulatory Visit (HOSPITAL_BASED_OUTPATIENT_CLINIC_OR_DEPARTMENT_OTHER)
Admission: RE | Admit: 2022-01-25 | Discharge: 2022-01-25 | Disposition: A | Discharge status: Home / Self Care | Payer: Medicare Other | Source: Ambulatory Visit | Attending: Family Medicine | Admitting: Family Medicine

## 2022-01-25 VITALS — BP 110/70 | Ht 66.0 in | Wt 130.0 lb

## 2022-01-25 DIAGNOSIS — H8112 Benign paroxysmal vertigo, left ear: Secondary | ICD-10-CM | POA: Diagnosis not present

## 2022-01-25 DIAGNOSIS — S83002A Unspecified subluxation of left patella, initial encounter: Secondary | ICD-10-CM | POA: Diagnosis not present

## 2022-01-25 DIAGNOSIS — S43002A Unspecified subluxation of left shoulder joint, initial encounter: Secondary | ICD-10-CM | POA: Diagnosis present

## 2022-01-25 DIAGNOSIS — S83001D Unspecified subluxation of right patella, subsequent encounter: Secondary | ICD-10-CM

## 2022-01-25 NOTE — Assessment & Plan Note (Signed)
Acutely occurring.  She endorses room spinning.  No recent injury and has been using a walker to help with her ambulation. ?-Counseled on home exercise therapy and supportive care. ?-Referral to physical therapy ?-Counseled on scopolamine. ?

## 2022-01-25 NOTE — Progress Notes (Signed)
?  Suzanne Fletcher - 31 y.o. female MRN [id number]  Date of birth: 04-20-1991 ? ?SUBJECTIVE:  Including CC & ROS.  ?No chief complaint on file. ? ? ?Suzanne Fletcher is a 31 y.o. female that is presenting with acute left shoulder pain and bilateral knee pain.  She reports her shoulder becoming out of socket.  She was able to get it back in place.  She is not having severe pain in this area.  She also reports that her knee will go in and out while walking. ? ? ?Review of Systems ?See HPI  ? ?HISTORY: Past Medical, Surgical, Social, and Family History Reviewed & Updated per EMR.   ?Pertinent Historical Findings include: ? ?Past Medical History:  ?Diagnosis Date  ? Asthma   ? Blind in both eyes   ? Migraine   ? Seizures (HCC)   ? ? ?Past Surgical History:  ?Procedure Laterality Date  ? EYE SURGERY    ? ? ? ?PHYSICAL EXAM:  ?VS: BP 110/70   Ht 5\' 6"  (1.676 m)   Wt 130 lb (59 kg)   BMI 20.98 kg/m?  ?Physical Exam ?Gen: NAD, alert, cooperative with exam, well-appearing ?MSK:  ?Neurovascularly intact   ? ? ? ? ?ASSESSMENT & PLAN:  ? ?Benign paroxysmal positional vertigo of left ear ?Acutely occurring.  She endorses room spinning.  No recent injury and has been using a walker to help with her ambulation. ?-Counseled on home exercise therapy and supportive care. ?-Referral to physical therapy ?-Counseled on scopolamine. ? ?Subluxation of right patella ?Acute on chronic in nature.  She reports her symptoms becoming progressively worse with walking.  No recent injury. ?-Counseled on home exercise therapy and supportive care. ?-Provided brace. ?-Could consider further imaging. ? ?Subluxation of left patella ?Acute on chronic in nature.  Reports her symptoms are gotten progressively worse ?-Counseled on home exercise therapy and supportive care. ?-Provided brace. ?-Could consider PRP or shockwave therapy. ? ?Shoulder subluxation, left, initial encounter ?Acutely occurring.  Does not appear to be dislocated today.  Does have  significant pain. ?-Counseled on home exercise therapy and supportive care. ?-Counseled on compression. ?-X-ray. ?-Could consider further imaging or physical therapy. ? ? ? ? ?

## 2022-01-25 NOTE — Assessment & Plan Note (Deleted)
Acute on chronic in nature.  Reports her symptoms are gotten progressively worse ?-Counseled on home exercise therapy and supportive care. ?-Provided brace. ?-Could consider PRP or shockwave therapy. ?

## 2022-01-25 NOTE — Assessment & Plan Note (Signed)
Acute on chronic in nature.  Reports her symptoms are gotten progressively worse ?-Counseled on home exercise therapy and supportive care. ?-Provided brace. ?-Could consider PRP or shockwave therapy. ?

## 2022-01-25 NOTE — Patient Instructions (Signed)
Good to see you ?Please try the braces on the knee  ?I will call with the xray results.  ?I've made a referral to neuro rehab for the vertigo  ?You can try over the counter scopolamine   ?Please send me a message in MyChart with any questions or updates.  ?Please see me back in 4 weeks.  ? ?--Dr. Jordan Likes ? ?

## 2022-01-25 NOTE — Assessment & Plan Note (Signed)
Acute on chronic in nature.  She reports her symptoms becoming progressively worse with walking.  No recent injury. ?-Counseled on home exercise therapy and supportive care. ?-Provided brace. ?-Could consider further imaging. ?

## 2022-01-25 NOTE — Assessment & Plan Note (Signed)
Acutely occurring.  Does not appear to be dislocated today.  Does have significant pain. ?-Counseled on home exercise therapy and supportive care. ?-Counseled on compression. ?-X-ray. ?-Could consider further imaging or physical therapy. ?

## 2022-01-28 ENCOUNTER — Telehealth: Payer: Self-pay | Admitting: Family Medicine

## 2022-01-28 NOTE — Telephone Encounter (Signed)
Informed of results.  ? ?[name], MD ?[facility] ?01/28/2022, 1:09 PM ? ?

## 2022-01-29 ENCOUNTER — Encounter: Payer: Medicaid Other | Admitting: Physical Therapy

## 2022-02-03 ENCOUNTER — Ambulatory Visit: Payer: Medicaid Other | Attending: Family Medicine | Admitting: Physical Therapy

## 2022-02-03 VITALS — BP 101/79 | HR 105

## 2022-02-03 DIAGNOSIS — R42 Dizziness and giddiness: Secondary | ICD-10-CM | POA: Diagnosis present

## 2022-02-03 DIAGNOSIS — R296 Repeated falls: Secondary | ICD-10-CM | POA: Insufficient documentation

## 2022-02-03 DIAGNOSIS — R2681 Unsteadiness on feet: Secondary | ICD-10-CM | POA: Diagnosis present

## 2022-02-03 DIAGNOSIS — R2689 Other abnormalities of gait and mobility: Secondary | ICD-10-CM | POA: Insufficient documentation

## 2022-02-03 DIAGNOSIS — H8112 Benign paroxysmal vertigo, left ear: Secondary | ICD-10-CM | POA: Diagnosis not present

## 2022-02-03 NOTE — Therapy (Addendum)
?OUTPATIENT PHYSICAL THERAPY VESTIBULAR EVALUATION ? ? ? ? ?Patient Name: Suzanne Fletcher ?MRN: [id number] ?DOB:06-11-1991, 31 y.o., female ?Today's Date: 02/03/2022 ? ?PCP: Lupita Raider, MD ?REFERRING PROVIDER: Myra Rude, MD  ? ? PT End of Session - 02/03/22 0804   ? ? Visit Number 1   ? Number of Visits 13   ? Date for PT Re-Evaluation 05/04/22   due to potential delay in scheduling  ? Authorization Type [employer] [facility] Medicaid   ? PT Start Time 0802   ? PT Stop Time 0847   ? PT Time Calculation (min) 45 min   ? Activity Tolerance Patient tolerated treatment well   limited by dizziness  ? Behavior During Therapy [facility] for tasks assessed/performed   ? ?  ?  ? ?  ? ? ?Past Medical History:  ?Diagnosis Date  ? Asthma   ? Blind in both eyes   ? Migraine   ? Seizures (HCC)   ? ?Past Surgical History:  ?Procedure Laterality Date  ? EYE SURGERY    ? ?Patient Active Problem List  ? Diagnosis Date Noted  ? Benign paroxysmal positional vertigo of left ear 01/25/2022  ? Subluxation of left patella 01/25/2022  ? Acute midline thoracic back pain 12/16/2021  ? Keeps losing balance 12/16/2021  ? POTS (postural orthostatic tachycardia syndrome) 05/21/2021  ? Locking of right knee 03/18/2021  ? Cervical strain 03/04/2021  ? Sprain of metacarpophalangeal joint of left little finger 03/04/2021  ? Carpal tunnel syndrome 10/08/2020  ? Shoulder subluxation, left, initial encounter 08/20/2020  ? Leg cramps 07/16/2020  ? Right foot drop 07/16/2020  ? Vitamin D deficiency 07/16/2020  ? Subluxation of right patella 06/17/2020  ? Shoulder subluxation, right, subsequent encounter 06/17/2020  ? 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  ? ? ?ONSET DATE: 01/25/2022  ? ?REFERRING DIAG: H81.12 (ICD-10-CM) - Benign paroxysmal positional vertigo of left ear  ? ?THERAPY DIAG:  ?Unsteadiness on feet ? ?Repeated falls ? ?Dizziness and giddiness ? ?Other abnormalities of gait and mobility ? ?SUBJECTIVE:  ? ?SUBJECTIVE STATEMENT: ?Feels like she has a lot  of dizziness and vertigo, feels like someone is pushing her over. Also has POTS, feels dizzy when she stands up. Also notices quick movements make her feel dizzy. Has been having dizziness episodes since she was in middle school. Dr. Jordan Likes also wanted to look into vestibular possibilities and that is why she was referred here. Also has a hx of migraines. Has not had physical therapy for dizziness. Pt is blind in both eyes, but only has central vision. Pt got a RW for her bad R knee because sometimes it will lock and also to help with balance.  ? ?Pt accompanied by: self and mom  ? ?PERTINENT HISTORY: POTS (diagnosed 09/2021), blind in both eyes, hx of migraines (is followed by neurology at [facility]), hx of seizures ? ? ? ?PAIN:  ?Are you having pain? Yes: NPRS scale: 7/10 ?Pain location: head  ?Pain description: Pressure ?Aggravating factors: Bright light ?Relieving factors: Dark room  ? ?Vitals:  ? 02/03/22 4270 02/03/22 6237  ?BP: 95/74 101/79  ?Pulse: 98 (!) 105  ? ? ? ?PRECAUTIONS: Other: blind in both eyes, has a seeing eye dog, fall  ? ?FALLS: Has patient fallen in last 6 months? Yes. Number of falls pt falls once a day, usually when getting up too fast when going to the bathroom.  ? ?LIVING ENVIRONMENT: ?Lives with: lives with their family ?Lives in: House/apartment ?Stairs: Yes: External: 2-3 steps,  also has a ramped entry.  ?Has following equipment at home: Dan Humphreys - 4 wheeled and Grab bars ? ?PLOF: Independent with household mobility with device, Needs assistance with homemaking, and Leisure: Likes horseback riding, playing video games.  ? ?PATIENT GOALS Wants to feel more confident walking around. Learn how to fall more safely.  ? ?OBJECTIVE:  ? ? ? ?COGNITION: ?Overall cognitive status: Within functional limits for tasks assessed ?  ? ?POSTURE: rounded shoulders ? ? ? ?TRANSFERS: ?Assistive device utilized: None  ?Sit to stand: SBA ?Stand to sit: SBA ?Stands with a wide BOS, needs to press up from chair to  stand.  ? ? ?GAIT: ?Gait pattern: step through pattern and wide BOS ?Distance walked: Clinic distances in and out of session ?Assistive device utilized:  uses a seeing eye dog due to being blind ?Level of assistance: SBA ?Comments: some unsteadiness, but no LOB.  ? ? ?PATIENT SURVEYS:  ?FOTO DPS 48 (56 predicted) ?DFS: 40.4 ? ?VESTIBULAR ASSESSMENT ? ? GENERAL OBSERVATION: Ambulates with no AD and a seeing eye dog.  ?  ? SYMPTOM BEHAVIOR: ?  Subjective history: See above.  ?  Non-Vestibular symptoms: changes in hearing, neck pain, headaches, tinnitus, nausea/vomiting, and migraine symptoms ?  Type of dizziness: Spinning/Vertigo ?  Frequency: Happens everyday. ?  Duration: Sometimes a couple hours, sometimes a couple days. Has triggered vertigo sometimes when playing video games with a lot of fast movements.  ?  Aggravating factors: Induced by position change: sit to stand, Induced by motion: turning body quickly and turning head quickly, and screen time ?  Relieving factors: rest and avoid busy/distracting environments ?  Progression of symptoms: better ? ? OCULOMOTOR EXAM: ?Pt is blind in both eyes, but does have central vision and some peripheral vision to the R and the L  and superior/inferior, but pt could not see pt's pen in upper and lower quadrants (when at a diagonal) ?  Ocular Alignment:  Head tilted to the R, L eye sitting slightly higher ?  Ocular ROM:  pt is blind, but can see centrally and some peripheral vision to the R and the L  ?  Spontaneous Nystagmus: absent ?  Gaze-Induced Nystagmus: absent ?  Smooth Pursuits: intact within pt's visual field ?  Saccades:  WNL, 2 saccadic beats with midline <> superior, pt reporting 5/10 dizziness with all directions  ?   ? ? ? VESTIBULAR - OCULAR REFLEX:  ?  Slow VOR: Normal mild dizziness ?  VOR Cancellation: Normal moderate dizziness  ?  Head-Impulse Test: HIT Right: negative ?HIT Left: positive severe dizziness afterwards.  ?  ?  ? ?MOTION  SENSITIVITY: ? ?  Motion Sensitivity Quotient ? ?Intensity: 0 = none, 1 = Lightheaded, 2 = Mild, 3 = Moderate, 4 = Severe, 5 = Vomiting ? Intensity  ?1. Sitting to supine   ?2. Supine to L side   ?3. Supine to R side   ?4. Supine to sitting   ?5. L Hallpike-Dix   ?6. Up from L    ?7. R Hallpike-Dix   ?8. Up from R    ?9. Sitting, head  ?tipped to L knee 3  ?10. Head up from L  ?knee 3  ?11. Sitting, head  ?tipped to R knee 2  ?12. Head up from R  ?knee 2  ?13. Sitting head turns x5 4 - pt leaning over in the chair due to feeling off balanced/dizzy  ?14.Sitting head nods x5 4 - pt leaning over  in the chair due to feeling off balanced/dizzy  ?15. In stance, 180?  ?turn to L    ?16. In stance, 180?  ?turn to R   ?  ?OTHOSTATICS: Sitting: 95/74 Standing: 101/74 ?When standing, pt almost losing balance backwards and needing min guard and pt needing to grab onto chair for balance. Pt stands with a wide BOS.  ? ? ?PATIENT EDUCATION: ?Education details: Clinical findings, POC, areas to work on in therapy, Medicaid visit limit (pt would like to save some visits to potentially use PT on her shoulder) ?Person educated: Patient and pt's mom ?Education method: Explanation ?Education comprehension: verbalized understanding ? ? ?Managed medicaid CPT codes: [id number]- Therapeutic Exercise, [phone]- Neuro Re-education, [phone] - Gait Training, [phone] - Manual Therapy, [phone] - Therapeutic Activities, [phone] - Self Care, and [phone] - Canalith Repositioning ? ? ?GOALS: ?Goals reviewed with patient? Yes ? ?SHORT TERM GOALS: Target date: 03/03/2022 ? ?Pt will be independent with initial HEP for vestibular and balance deficits with mom's supervision in order to build upon functional gains made in therapy. ? ?Baseline: No HEP ?Goal status: INITIAL ? ?2.  Pt will undergo further assessment of BERG with LTG written. ?Baseline: Not yet assessed.  ?Goal status: INITIAL ? ?3.  Pt will perform 5 reps of sitting head nods and head turns with moderate  dizziness (3 on MSQ) or less in order to demo improved motion sensitivity.  ?Baseline: severe dizziness, 4 on MSQ ?Goal status: INITIAL ? ?4.  Pt will finish further assessment of MSQ with STG updated. ?Baseline: Need to finish

## 2022-02-16 ENCOUNTER — Ambulatory Visit: Payer: Medicaid Other | Admitting: Physical Therapy

## 2022-02-17 ENCOUNTER — Other Ambulatory Visit: Payer: Self-pay | Admitting: Family Medicine

## 2022-02-17 DIAGNOSIS — S43001A Unspecified subluxation of right shoulder joint, initial encounter: Secondary | ICD-10-CM

## 2022-02-17 DIAGNOSIS — S83001D Unspecified subluxation of right patella, subsequent encounter: Secondary | ICD-10-CM

## 2022-02-22 ENCOUNTER — Ambulatory Visit (HOSPITAL_BASED_OUTPATIENT_CLINIC_OR_DEPARTMENT_OTHER)
Admission: RE | Admit: 2022-02-22 | Discharge: 2022-02-22 | Disposition: A | Discharge status: Home / Self Care | Payer: Medicare Other | Source: Ambulatory Visit | Attending: Family Medicine | Admitting: Family Medicine

## 2022-02-22 ENCOUNTER — Encounter: Payer: Self-pay | Admitting: Family Medicine

## 2022-02-22 ENCOUNTER — Ambulatory Visit: Payer: Medicaid Other | Admitting: Physical Therapy

## 2022-02-22 ENCOUNTER — Ambulatory Visit: Payer: Medicaid Other | Admitting: Family Medicine

## 2022-02-22 VITALS — BP 100/70 | Ht 66.0 in | Wt 130.0 lb

## 2022-02-22 DIAGNOSIS — S73002A Unspecified subluxation of left hip, initial encounter: Secondary | ICD-10-CM | POA: Insufficient documentation

## 2022-02-22 DIAGNOSIS — M7742 Metatarsalgia, left foot: Secondary | ICD-10-CM

## 2022-02-22 DIAGNOSIS — M7741 Metatarsalgia, right foot: Secondary | ICD-10-CM

## 2022-02-22 NOTE — Progress Notes (Signed)
  Suzanne Fletcher - 31 y.o. female MRN [id number]  Date of birth: [birth year]  SUBJECTIVE:  Including CC & ROS.  No chief complaint on file.   Suzanne Fletcher is a 31 y.o. female that is presenting with acute left hip pain.  She reports a subluxation of the left hip.  This is the first time this is happened.  Having some soreness right now in the groin.  Also having bilateral foot burning.  This lasted for about a week.  Notices this with certain kinds of shoes.   Review of Systems See HPI   HISTORY: Past Medical, Surgical, Social, and Family History Reviewed & Updated per EMR.   Pertinent Historical Findings include:  Past Medical History:  Diagnosis Date   Asthma    Blind in both eyes    Migraine    Seizures (HCC)     Past Surgical History:  Procedure Laterality Date   EYE SURGERY       PHYSICAL EXAM:  VS: BP 100/70 (BP Location: Left Arm, Patient Position: Sitting)   Ht 5\' 6"  (1.676 m)   Wt 130 lb (59 kg)   LMP 02/19/2022   BMI 20.98 kg/m  Physical Exam Gen: NAD, alert, cooperative with exam, well-appearing MSK:  Neurovascularly intact       ASSESSMENT & PLAN:   Hip subluxation, left, initial encounter (HCC) Acutely occurring.  She reported a subluxation.  Has subsequent pain and soreness around the joint.  Is fully functional today. -Counseled on home exercise therapy and supportive care. -X-ray. -Could consider injection or physical therapy  Metatarsalgia of both feet Acutely occurring.  Symptoms seem more consistent with metatarsalgia as opposed to fracture or nerve related. -Counseled on home exercise therapy and supportive care. -Could consider custom orthotics

## 2022-02-22 NOTE — Patient Instructions (Signed)
Good to see you Please try the exercises  Please consider compression  I will call with the xray results.   Please send me a message in MyChart with any questions or updates.  Please see me back in 2-3 months.   --Dr. Jordan Likes

## 2022-02-22 NOTE — Assessment & Plan Note (Signed)
Acutely occurring.  Symptoms seem more consistent with metatarsalgia as opposed to fracture or nerve related. -Counseled on home exercise therapy and supportive care. -Could consider custom orthotics

## 2022-02-22 NOTE — Assessment & Plan Note (Signed)
Acutely occurring.  She reported a subluxation.  Has subsequent pain and soreness around the joint.  Is fully functional today. -Counseled on home exercise therapy and supportive care. -X-ray. -Could consider injection or physical therapy

## 2022-02-23 ENCOUNTER — Telehealth: Payer: Self-pay | Admitting: Family Medicine

## 2022-02-23 ENCOUNTER — Encounter: Payer: Self-pay | Admitting: Cardiology

## 2022-02-23 ENCOUNTER — Ambulatory Visit: Payer: Medicaid Other | Admitting: Cardiology

## 2022-02-23 VITALS — BP 108/64 | HR 119 | Ht 66.0 in | Wt 163.2 lb

## 2022-02-23 DIAGNOSIS — R000 Tachycardia, unspecified: Secondary | ICD-10-CM | POA: Diagnosis not present

## 2022-02-23 DIAGNOSIS — G901 Familial dysautonomia [Riley-Day]: Secondary | ICD-10-CM

## 2022-02-23 MED ORDER — METOPROLOL SUCCINATE ER 50 MG PO TB24: 50.0000 mg | ORAL_TABLET | Freq: Every day | ORAL | 3 refills | Status: DC

## 2022-02-23 NOTE — Telephone Encounter (Signed)
Left VM for patient. If she calls back please have her speak with a nurse/CMA and inform that her xrays are normal.   If any questions then please take the best time and phone number to call and I will try to call her back.   Myra Rude, MD [facility] 02/23/2022, 8:21 AM

## 2022-02-23 NOTE — Progress Notes (Signed)
Electrophysiology Office Follow up Visit Note:    Date:  02/23/2022   ID:  Suzanne Fletcher, DOB 06/07/1991, MRN [id number]  PCP:  Lupita RaiderShaw, Kimberlee, MD  [facility] Cardiologist:  None  [facility] Electrophysiologist:  Lanier PrudeAMERON T Wisdom Rickey, MD    Interval History:    Suzanne Fletcher is a 31 y.o. female who presents for a follow up visit. They were last seen in clinic 08/27/2021.  Since their last appointment, they followed up with Francis Dowseenee Ursuy, PA-C on 10/02/2021. She still reported having some spikes in heart rates with standing and exertion, none less than 90 bpm. Her Toprol was increased to 50 mg daily and she would monitor her heart rates.  She is accompanied by a family member. Overall, she states she is mostly alright. She believes there have been improvement in her heart rate this week, but she had some higher rates last week. Per her smart watch, her average heart rate is about 90-105 bpm. With exertion her HR may climb to 130-140 bpm.  At night, her watch has recorded her heart rate as low as 50-60 bpm but averaging in the 70's. She remains compliant with metoprolol.  Previously she tried to wear compression socks but she found them to be agonizingly painful.  She denies any chest pain, shortness of breath, or peripheral edema. No lightheadedness, headaches, syncope, orthopnea, or PND.      Past Medical History:  Diagnosis Date   Asthma    Blind in both eyes    Migraine    Seizures (HCC)     Past Surgical History:  Procedure Laterality Date   EYE SURGERY      Current Medications: Current Meds  Medication Sig   albuterol (PROVENTIL HFA;VENTOLIN HFA) 108 (90 Base) MCG/ACT inhaler Inhale into the lungs as needed.   baclofen (LIORESAL) 10 MG tablet TAKE 1/2 TO 1 TABLET(5 TO 10 MG) BY MOUTH THREE TIMES DAILY AS NEEDED FOR MUSCLE SPASMS   budesonide-formoterol (SYMBICORT) 80-4.5 MCG/ACT inhaler as needed.   celecoxib (CELEBREX) 200 MG capsule TAKE 1 CAPSULE(200 MG)  BY MOUTH TWICE DAILY AS NEEDED   Eptinezumab-jjmr (VYEPTI IV) Inject into the vein.   Ferrous Sulfate (IRON) 325 (65 Fe) MG TABS daily.   fluticasone (FLONASE) 50 MCG/ACT nasal spray Place into both nostrils.   HYDROcodone-acetaminophen (NORCO/VICODIN) 5-325 MG tablet Take 1 tablet by mouth every 6 (six) hours as needed for moderate pain.   hydrocortisone 2.5 % cream Apply topically 2 (two) times daily.   levETIRAcetam (KEPPRA) 750 MG tablet Take by mouth.   levocetirizine (XYZAL) 5 MG tablet Take by mouth.   metoprolol succinate (TOPROL XL) 50 MG 24 hr tablet Take 1 tablet (50 mg total) by mouth daily.   montelukast (SINGULAIR) 10 MG tablet Take by mouth.   nortriptyline (PAMELOR) 10 MG capsule Take 10 mg by mouth 2 (two) times daily.   Omega-3 Fatty Acids (FISH OIL) 1000 MG CAPS daily.   omeprazole (PRILOSEC) 20 MG capsule Take 20 mg by mouth daily.   ondansetron (ZOFRAN) 4 MG tablet Take 4 mg by mouth 3 (three) times daily as needed.     Allergies:   Ampicillin, Cefadroxil, Cefixime, Cephalosporins, Penicillins, Sulfa antibiotics, and Sulfasalazine   Social History   Socioeconomic History   Marital status: Single    Spouse name: Not on file   Number of children: Not on file   Years of education: Not on file   Highest education level: Not on file  Occupational History  Not on file  Tobacco Use   Smoking status: Never   Smokeless tobacco: Never  Vaping Use   Vaping Use: Never used  Substance and Sexual Activity   Alcohol use: No   Drug use: Not on file   Sexual activity: Not on file  Other Topics Concern   Not on file  Social History Narrative   Not on file   Social Determinants of Health   Financial Resource Strain: Not on file  Food Insecurity: Not on file  Transportation Needs: Not on file  Physical Activity: Not on file  Stress: Not on file  Social Connections: Not on file     Family History: The patient's family history includes Diabetes in her mother;  Hypertension in her mother.  ROS:   Please see the history of present illness.     All other systems reviewed and are negative.  EKGs/Labs/Other Studies Reviewed:    The following studies were reviewed today:  March 27, 2019 CT PE protocol No thoracic aortic aneurysm or dissection   March 28, 2019 EKG shows sinus tachycardia and an incomplete right bundle branch block.  EKG:  EKG is personally reviewed.  02/23/2022: EKG was not ordered. 08/07/2021:  sinus tachycardia with a ventricular to 118 bpm.   Recent Labs: No results found for requested labs within last 8760 hours.   Recent Lipid Panel No results found for: CHOL, TRIG, HDL, CHOLHDL, VLDL, LDLCALC, LDLDIRECT  Physical Exam:    VS:  BP 108/64   Pulse (!) 119   Ht 5\' 6"  (1.676 m)   Wt 163 lb 3.2 oz (74 kg)   LMP 02/19/2022   BMI 26.34 kg/m     Wt Readings from Last 3 Encounters:  02/23/22 163 lb 3.2 oz (74 kg)  02/22/22 130 lb (59 kg)  01/25/22 130 lb (59 kg)     GEN: Well nourished, well developed in no acute distress HEENT: Normal NECK: No JVD; No carotid bruits LYMPHATICS: No lymphadenopathy CARDIAC: RRR, no murmurs, rubs, gallops RESPIRATORY:  Clear to auscultation without rales, wheezing or rhonchi  ABDOMEN: Soft, non-tender, non-distended MUSCULOSKELETAL:  No edema; No deformity  SKIN: Warm and dry NEUROLOGIC:  Alert and oriented x 3 PSYCHIATRIC:  Normal affect        ASSESSMENT:    1. Sinus tachycardia   2. Dysautonomia (HCC)    PLAN:    In order of problems listed above:  #Sinus tachycardia #Dysautonomia I have reviewed her Apple Watch tracings of average heart rates.  Nighttime heart rates are as low as in the 50s but average in the 70s.  Daytime heart rates average in the 90s to low 100s with peak heart rates in the 130s to 140s.  Overall this is improved heart rate from our earlier visits.  I would recommend continuing metoprolol at this time.  I recommended that she pursue compression  stockings from a medical supply store.  She should continue to stay active.   Follow-up in 1 year with APP.  Medication Adjustments/Labs and Tests Ordered: Current medicines are reviewed at length with the patient today.  Concerns regarding medicines are outlined above.   No orders of the defined types were placed in this encounter.  No orders of the defined types were placed in this encounter.   I,Mathew Stumpf,acting as a 01/27/22 for Neurosurgeon, MD.,have documented all relevant documentation on the behalf of Lanier Prude, MD,as directed by  Lanier Prude, MD while in the presence of  Lanier Prude, MD.  I, Lanier Prude, MD, have reviewed all documentation for this visit. The documentation on 02/23/22 for the exam, diagnosis, procedures, and orders are all accurate and complete.   Signed, Steffanie Dunn, MD, [facility], [facility] 02/23/2022 8:41 AM    Electrophysiology [facility]

## 2022-02-23 NOTE — Patient Instructions (Addendum)

## 2022-02-24 ENCOUNTER — Ambulatory Visit: Payer: Medicaid Other

## 2022-02-25 ENCOUNTER — Ambulatory Visit: Payer: Medicaid Other

## 2022-02-25 VITALS — BP 107/74

## 2022-02-25 DIAGNOSIS — R42 Dizziness and giddiness: Secondary | ICD-10-CM

## 2022-02-25 DIAGNOSIS — R2681 Unsteadiness on feet: Secondary | ICD-10-CM

## 2022-02-25 DIAGNOSIS — R2689 Other abnormalities of gait and mobility: Secondary | ICD-10-CM

## 2022-02-25 DIAGNOSIS — R296 Repeated falls: Secondary | ICD-10-CM

## 2022-02-25 NOTE — Patient Instructions (Signed)
Gaze Stabilization: Sitting    Keeping eyes on target on wall 2-3 feet away, tilt head down 15-30 and move head side to side for 30 seconds. Repeat while moving head up and down for 30 seconds. Do 2-3 sessions per day.   Gaze Stabilization: Tip Card  1.Target must remain in focus, not blurry, and appear stationary while head is in motion. 2.Perform exercises with small head movements (45 to either side of midline). 3.Increase speed of head motion so long as target is in focus. 4.If you wear eyeglasses, be sure you can see target through lens (therapist will give specific instructions for bifocal / progressive lenses). 5.These exercises may provoke dizziness or nausea. Work through these symptoms. If too dizzy, slow head movement slightly. Rest between each exercise. 6.Exercises demand concentration; avoid distractions. 7.For safety, perform standing exercises close to a counter, wall, corner, or next to someone.  Copyright  VHI. All rights reserved.    

## 2022-02-25 NOTE — Therapy (Signed)
OUTPATIENT PHYSICAL THERAPY VESTIBULAR TREATMENT NOTE   Patient Name: Suzanne Fletcher MRN: [id number] DOB:[birth year], 31 y.o., female Today's Date: 02/25/2022  PCP: Mayra Neer, MD REFERRING PROVIDER: [name], MD    PT End of Session - 02/25/22 0845     Visit Number 2    Number of Visits 13    Date for PT Re-Evaluation 05/04/22   due to potential delay in scheduling   Authorization Type [facility]    Authorization Time Period 5 PT visits 02/25/2022 - 04/25/2022    PT Start Time 0846    PT Stop Time 0929    PT Time Calculation (min) 43 min    Activity Tolerance Patient tolerated treatment well   limited by dizziness   Behavior During Therapy [facility] for tasks assessed/performed             Past Medical History:  Diagnosis Date   Asthma    Blind in both eyes    Migraine    Seizures ([place])    Past Surgical History:  Procedure Laterality Date   EYE SURGERY     Patient Active Problem List   Diagnosis Date Noted   Hip subluxation, left, initial encounter ([place]) 02/22/2022   Metatarsalgia of both feet 02/22/2022   Benign paroxysmal positional vertigo of left ear 01/25/2022   Subluxation of left patella 01/25/2022   Acute midline thoracic back pain 12/16/2021   Keeps losing balance 12/16/2021   POTS (postural orthostatic tachycardia syndrome) 05/21/2021   Locking of right knee 03/18/2021   Cervical strain 03/04/2021   Sprain of metacarpophalangeal joint of left little finger 03/04/2021   Carpal tunnel syndrome 10/08/2020   Shoulder subluxation, left, initial encounter 08/20/2020   Leg cramps 07/16/2020   Right foot drop 07/16/2020   Vitamin D deficiency 07/16/2020   Subluxation of right patella 06/17/2020   Shoulder subluxation, right, subsequent encounter 06/17/2020   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 Reports she has had some dizzy spells. One day where she was unable to get out of bed due to the dizziness. No other new changes/complaints. Reports has HA approximately daily. Using RW at night, and intermittent.   PAIN:  Are you having pain? Yes: NPRS scale: 7/10 Pain location: Frontal Pain description: Headache   OBJECTIVE:   [facility] PT Assessment - 02/25/22 0001       Standardized Balance Assessment   Standardized Balance Assessment Berg Balance Test      Berg Balance Test   Sit to Stand Able to stand  independently using hands    Standing Unsupported Able to stand safely 2 minutes    Sitting with Back Unsupported but Feet Supported on Floor or Stool Able to sit safely and securely 2 minutes    Stand to Sit Sits safely with minimal use of hands    Transfers Able to transfer safely, definite need of hands    Standing Unsupported with Eyes Closed Able to stand  10 seconds with supervision    Standing Unsupported with Feet Together Able to place feet together independently and stand for 1 minute with supervision    From Standing, Reach Forward with Outstretched Arm Can reach confidently >25 cm (10")    From Standing Position, Pick up Object from [place] to pick up shoe safely and easily    From Standing Position, Turn to Look Behind Over each Shoulder Looks behind from both sides and weight shifts well    Turn 360 Degrees Able to turn 360 degrees safely but slowly    Standing Unsupported, Alternately Place Feet on Step/Stool Able to stand independently  and safely and complete 8 steps in 20 seconds    Standing Unsupported, One Foot in Front Able to plae foot ahead of the other independently and hold 30 seconds    Standing on One Leg Able to lift leg independently and hold equal to or more than 3 seconds    Total Score 47             MOTION SENSITIVITY:                         Motion Sensitivity Quotient   Intensity: 0 = none, 1 = Lightheaded, 2 = Mild, 3 = Moderate, 4 = Severe, 5 = Vomiting   Intensity  1. Sitting to supine  2  2. Supine to L side  0  3. Supine to R side  0  4. Supine to sitting  1  5. L Hallpike-Dix  0  6. Up from L   1  7. R Hallpike-Dix  0  8. Up from R   1  9. Sitting, head  tipped to L knee 3  10. Head up from L  knee 3  11. Sitting, head  tipped to R knee 2  12. Head up from R  knee 2  13. Sitting head turns x5 4 - pt leaning over in the chair due to feeling off balanced/dizzy  14.Sitting head nods x5 4 - pt leaning over in the chair due to feeling off balanced/dizzy  15. In stance, 180  turn to L  2  16. In stance, 180  turn to R 1    VESTIBULAR TREATMENT:     Gaze Adaptation: x1 Viewing Horizontal: Position: seated, Time: 30 secs, and Reps: 1 and x1 Viewing Vertical:  Position: seated, Time: 30 secs, and Reps: 1  Gaze Stabilization: Sitting    Keeping eyes on target on wall 2-3 feet away, tilt head down 15-30 and move head side to side for 30 seconds. Repeat while moving head up and down for 30 seconds. Do 2-3 sessions per day.   PATIENT EDUCATION: Education details: Initial HEP Person educated: Patient and Pt's mom Education method: Explanation Education comprehension: verbalized understanding      GOALS: Goals reviewed with patient? Yes   SHORT TERM GOALS: Target date: 03/03/2022   Pt will be independent with initial HEP for vestibular and balance deficits with mom's supervision in order to build upon functional gains made in therapy.   Baseline: No HEP Goal status:  INITIAL   2.  Pt will undergo further assessment of BERG with LTG written. Baseline: 47/56 Goal status: MET   3.  Pt will perform 5 reps of sitting head nods and head turns with moderate dizziness (3 on MSQ) or less in order to demo improved motion sensitivity.  Baseline: severe dizziness, 4 on MSQ  Goal status: INITIAL   4.  Pt will report </= 3/5 for all movements on MSQ to indicate improvement in motion sensitivity and improved activity tolerance.   Baseline: 2-4/5 Goal status: MET       LONG TERM GOALS: Target date: 03/31/2022   Pt will be independent with final HEP for vestibular and balance deficits with mom's supervision in order to build upon functional gains made in therapy. Baseline: No HEP  Goal status: INITIAL   2.  Pt will improve Berg Balance to >/= 51/56 to demonstrate improved standing balance and reduced fall risk      Baseline: Not yet assessed.  Goal status: REVISED   3.  Pt will improve DPS to at least a 56% in order to demo improved functional outcomes. Baseline: 48%           Goal status: INITIAL   4.  Pt will subjectively report being able to tolerate at least 30 minutes of video games with minimal to no dizziness. Baseline: Pt reports incr dizziness episodes while playing video games.  Goal status: INITIAL   5.  Pt will perform all items of MSQ with 2 (mild) or less dizziness in order to demo improved motion sensitivity. Baseline: Scores ranging from mild-severe, with pt needing further assessment of remainder of items.  Goal status: INITIAL     ASSESSMENT:   CLINICAL IMPRESSION: Today's skilled PT session focused on finishing MSQ assessment, patient demonstrating increased motion sensitivity with mild-mod dizziness. Then further assessed balance with Berg Balance, most challenge noted with narrow BOS activities and SLS. Patient scored 47/56 demonstrating moderate fall risk. Rest of session spent establishing initial VOR HEP, mod dizziness reported.  Will continue to progress toward all LTGs.      OBJECTIVE IMPAIRMENTS Abnormal gait, cardiopulmonary status limiting activity, decreased activity tolerance, decreased balance, difficulty walking, decreased strength, dizziness, impaired vision/preception, postural dysfunction, and pain.    ACTIVITY LIMITATIONS community activity, meal prep, and playing video games .    PERSONAL FACTORS Behavior pattern, Past/current experiences, Time since onset of injury/illness/exacerbation, and POTS (diagnosed 09/2021), blind in both eyes, hx of migraines (is followed by neurology at [facility]), hx of seizures, longstanding hx of dizziness.  are also affecting patient's functional outcome.      REHAB POTENTIAL: Fair due to chronicity of condition   CLINICAL DECISION MAKING: Evolving/moderate complexity   EVALUATION COMPLEXITY: Moderate     PLAN: PT FREQUENCY: 1-2x/week   PT DURATION: 12 weeks   PLANNED INTERVENTIONS: Therapeutic exercises, Therapeutic activity, Neuromuscular re-education, Balance training, Gait training, Patient/Family education, Stair training, Vestibular training, Canalith repositioning, Visual/preceptual remediation/compensation, DME instructions, and Manual therapy   PLAN FOR NEXT SESSION: Pt has a super cute golden retriever seeing eye dog. Pt also treated in dim lit room. Review VOR. Add balance to HEP. Also Add more to HEP (pt gets very symptomatic) for oculomotor exercises (within pt's visual field),, and other exercises that pt had difficulty with on MSQ.      Managed medicaid CPT codes: [phone]- Therapeutic Exercise, [phone]- Neuro Re-education, [phone] - Gait Training, [phone] - Manual Therapy, [phone] - Therapeutic Activities, [phone] - Self Care, and [place], PT, DPT 02/25/2022, 9:48 AM

## 2022-03-02 ENCOUNTER — Ambulatory Visit: Payer: Medicaid Other | Admitting: Physical Therapy

## 2022-03-03 ENCOUNTER — Telehealth: Payer: Self-pay | Admitting: Family Medicine

## 2022-03-03 NOTE — Telephone Encounter (Signed)
Pt's mom/Tammy called to see if X-ray results are in.  --Forwarding message to med asst to review w/provdier for release.  -glh

## 2022-03-04 ENCOUNTER — Ambulatory Visit: Payer: Medicare Other | Attending: Family Medicine | Admitting: Physical Therapy

## 2022-03-04 ENCOUNTER — Encounter: Payer: Self-pay | Admitting: Physical Therapy

## 2022-03-04 DIAGNOSIS — R2681 Unsteadiness on feet: Secondary | ICD-10-CM | POA: Diagnosis present

## 2022-03-04 DIAGNOSIS — R42 Dizziness and giddiness: Secondary | ICD-10-CM | POA: Diagnosis present

## 2022-03-04 DIAGNOSIS — R296 Repeated falls: Secondary | ICD-10-CM | POA: Insufficient documentation

## 2022-03-04 DIAGNOSIS — R2689 Other abnormalities of gait and mobility: Secondary | ICD-10-CM | POA: Insufficient documentation

## 2022-03-04 NOTE — Telephone Encounter (Signed)
Left detailed message informing pt of below.  

## 2022-03-04 NOTE — Therapy (Signed)
OUTPATIENT PHYSICAL THERAPY VESTIBULAR TREATMENT NOTE   Patient Name: Suzanne Fletcher MRN: [id number] DOB:1991-05-19, 31 y.o., female Today's Date: 03/04/2022  PCP: Mayra Neer, MD REFERRING PROVIDER: [name], MD    PT End of Session - 03/04/22 4496     Visit Number 3    Number of Visits 13    Date for PT Re-Evaluation 05/04/22   due to potential delay in scheduling   Authorization Type Healthy [facility]    Authorization Time Period 5 PT visits 02/25/2022 - 04/25/2022    Authorization - Visit Number 3    Authorization - Number of Visits 5    PT Start Time 0806   pt arrived late to session   PT Stop Time 0845    PT Time Calculation (min) 39 min    Activity Tolerance Patient tolerated treatment well    Behavior During Therapy [facility] for tasks assessed/performed             Past Medical History:  Diagnosis Date   Asthma    Blind in both eyes    Migraine    Seizures ([place])    Past Surgical History:  Procedure Laterality Date   EYE SURGERY     Patient Active Problem List   Diagnosis Date Noted   Hip subluxation, left, initial encounter ([place]) 02/22/2022   Metatarsalgia of both feet 02/22/2022   Benign paroxysmal positional vertigo of left ear 01/25/2022   Subluxation of left patella 01/25/2022   Acute midline thoracic back pain 12/16/2021   Keeps losing balance 12/16/2021   POTS (postural orthostatic tachycardia syndrome) 05/21/2021   Locking of right knee 03/18/2021   Cervical strain 03/04/2021   Sprain of metacarpophalangeal joint of left little finger 03/04/2021   Carpal tunnel syndrome 10/08/2020   Shoulder subluxation, left, initial encounter 08/20/2020   Leg cramps 07/16/2020   Right foot drop 07/16/2020   Vitamin D deficiency 07/16/2020   Subluxation of right patella 06/17/2020   Shoulder subluxation, right, subsequent encounter 06/17/2020   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 Got dizzy. Exercises are going well at home.  Pt went to see her PCP and reports that she said that she should ask about a power w/c. Wearing a body braid today to help her back pain.    PAIN:  Are you having pain? Yes: NPRS scale: 8/10 Pain location: Head, hip, back Pain description: Aching, sore.    OBJECTIVE:     VESTIBULAR TREATMENT:     Gaze Adaptation: x1 Viewing Horizontal: Position: seated, Time: 30 secs, and Reps: 2, mild (almost moderate) dizziness  and x1 Viewing Vertical:  Position: seated, Time: 30 secs, and Reps: 2. Mild dizziness.    Habituation: Bending down to one side and picking up object from floor and then coming back to center (moderate dizziness when coming to center), performed x5 reps each side. Pt with improvement in sx with incr reps).   Sitting head turns x5  reps (very mild dizziness), head nods x5 reps (started to feel it a little bit)   Access Code: [email or web address] URL: [email or web address] Date: 03/04/2022 Prepared by: Janann August  Added to initial HEP for balance. All exercises performed at countertop with UE support with supervision. See MedBridge for more details.   Exercises - Standing with Head Rotation  - 1 x daily - 5 x weekly - 2 sets - 5 reps - moderate dizziness - Standing with Head Nod  - 1 x daily - 5 x weekly - 2 sets - 5 reps - mild dizziness  - Standing Single Leg Stance with Counter Support  - 1 x daily - 5 x weekly - 3 sets -  15 hold  - Standing Romberg to 3/4 Tandem Stance  - 1 x daily - 5 x weekly - 3 sets - 30 hold   PATIENT EDUCATION: Education details: Additions to HEP, Medicaid visit limit.  Person educated: Patient and Pt's mom Education method: Explanation and Handout  Education comprehension: verbalized and demonstrated understanding      GOALS: Goals reviewed with patient? Yes   SHORT TERM GOALS: Target date: 03/03/2022   Pt will be independent with initial HEP for vestibular and balance deficits with mom's supervision in order to build upon functional gains made in therapy.   Baseline: HEP for VOR and standing balance  Goal status: MET   2.  Pt will undergo further assessment of BERG with LTG written. Baseline: 47/56 Goal status: MET   3.  Pt will perform 5 reps of sitting head nods and head turns with moderate dizziness (3 on MSQ) or less in order to demo improved motion sensitivity.  Baseline: mild dizziness on 03/04/22 Goal status: MET   4.  Pt will report </= 3/5 for all movements on MSQ to indicate improvement in motion sensitivity and improved activity tolerance.   Baseline: 2-4/5 Goal status: MET       LONG TERM GOALS: Target date: 03/31/2022   Pt will be independent with final HEP for vestibular and balance deficits with mom's supervision in order to build upon functional gains made in therapy. Baseline: No HEP  Goal status: INITIAL   2.  Pt will improve Berg Balance to >/= 51/56 to demonstrate improved standing balance and reduced fall risk      Baseline: Not yet assessed.  Goal status: REVISED   3.  Pt will improve DPS to at least a 56% in order to demo improved functional outcomes. Baseline: 48%           Goal status: INITIAL   4.  Pt will subjectively report being able to tolerate at least 30 minutes of video games with minimal to no dizziness. Baseline: Pt reports incr dizziness episodes while playing video games.  Goal status: INITIAL   5.  Pt will perform all  items of MSQ with 2 (mild) or less dizziness in order to demo improved motion sensitivity. Baseline: Scores ranging from mild-severe, with pt needing further assessment of remainder of items.  Goal status: INITIAL     ASSESSMENT:   CLINICAL IMPRESSION: Reviewed VOR given from last session for HEP with pt reporting mild/moderate sx with head turns. Unable to progress time today. Remainder of session focused on adding exercises to HEP for habituation/head motions and balance. Pt more challenged by head turns. Pt able to tolerate session well, will continue to progress towards LTGs. Will continue to progress toward all LTGs.      OBJECTIVE IMPAIRMENTS Abnormal  gait, cardiopulmonary status limiting activity, decreased activity tolerance, decreased balance, difficulty walking, decreased strength, dizziness, impaired vision/preception, postural dysfunction, and pain.    ACTIVITY LIMITATIONS community activity, meal prep, and playing video games .    PERSONAL FACTORS Behavior pattern, Past/current experiences, Time since onset of injury/illness/exacerbation, and POTS (diagnosed 09/2021), blind in both eyes, hx of migraines (is followed by neurology at [facility]), hx of seizures, longstanding hx of dizziness.  are also affecting patient's functional outcome.      REHAB POTENTIAL: Fair due to chronicity of condition   CLINICAL DECISION MAKING: Evolving/moderate complexity   EVALUATION COMPLEXITY: Moderate     PLAN: PT FREQUENCY: 1-2x/week   PT DURATION: 12 weeks   PLANNED INTERVENTIONS: Therapeutic exercises, Therapeutic activity, Neuromuscular re-education, Balance training, Gait training, Patient/Family education, Stair training, Vestibular training, Canalith repositioning, Visual/preceptual remediation/compensation, DME instructions, and Manual therapy   PLAN FOR NEXT SESSION: Pt has a super cute golden retriever seeing eye dog. Pt also treated in dim lit room. Review VOR and progress time as  needed. How is HEP for balance? Standing balance with SLS/narrow BOS, oculomotor exercises (within pt's visual field), head motions, habituation to bending and turning.    Managed medicaid CPT codes: [phone]- Therapeutic Exercise, [phone]- Neuro Re-education, [phone] - Gait Training, [phone] - Manual Therapy, [phone] - Therapeutic Activities, [phone] - Self Care, and [phone] - Canalith Repositioning   Arliss Journey, PT, DPT 03/04/2022, 8:47 AM

## 2022-03-04 NOTE — Telephone Encounter (Signed)
See 02/23/22 tele encounter. Left detailed message informing pt of normal x ray results.

## 2022-03-04 NOTE — Patient Instructions (Signed)
Bending / Picking Up Objects    Sitting, slowly bend head down and pick up object on the floor. Return to upright position. Hold position until symptoms subside. Perform alternating to each side.  Repeat ___5_ times per session. Do __1-2__ sessions per day.  Copyright  VHI. All rights reserved.

## 2022-03-08 ENCOUNTER — Ambulatory Visit: Payer: Medicaid Other | Admitting: Family Medicine

## 2022-03-08 ENCOUNTER — Other Ambulatory Visit (HOSPITAL_COMMUNITY): Payer: Self-pay | Admitting: Family Medicine

## 2022-03-08 ENCOUNTER — Encounter: Payer: Self-pay | Admitting: Family Medicine

## 2022-03-08 ENCOUNTER — Ambulatory Visit (HOSPITAL_BASED_OUTPATIENT_CLINIC_OR_DEPARTMENT_OTHER)
Admission: RE | Admit: 2022-03-08 | Discharge: 2022-03-08 | Disposition: A | Discharge status: Home / Self Care | Payer: Medicare Other | Source: Ambulatory Visit | Attending: Family Medicine | Admitting: Family Medicine

## 2022-03-08 VITALS — BP 90/64 | Ht 66.0 in | Wt 163.0 lb

## 2022-03-08 DIAGNOSIS — S43005A Unspecified dislocation of left shoulder joint, initial encounter: Secondary | ICD-10-CM | POA: Insufficient documentation

## 2022-03-08 DIAGNOSIS — M357 Hypermobility syndrome: Secondary | ICD-10-CM

## 2022-03-08 NOTE — Assessment & Plan Note (Signed)
Acutely occurring.  Having pain around the joint globally.  Shoulder is in place today. -Counseled on home exercise therapy and supportive care. -X-ray. -Sling provided. -Could consider injection.

## 2022-03-08 NOTE — Progress Notes (Signed)
  Suzanne Fletcher - 31 y.o. female MRN [id number]  Date of birth: 11/20/1990  SUBJECTIVE:  Including CC & ROS.  No chief complaint on file.   Suzanne Fletcher is a 31 y.o. female that is presenting with acute left shoulder pain.  She reports having a dislocation recently.  Denies any injury or trauma.  She does have a history of multiple falls.   Review of Systems See HPI   HISTORY: Past Medical, Surgical, Social, and Family History Reviewed & Updated per EMR.   Pertinent Historical Findings include:  Past Medical History:  Diagnosis Date   Asthma    Blind in both eyes    Migraine    Seizures ([facility])     Past Surgical History:  Procedure Laterality Date   EYE SURGERY       PHYSICAL EXAM:  VS: BP 90/64 (BP Location: Right Arm, Patient Position: Sitting)   Ht 5\' 6"  (1.676 m)   Wt 163 lb (73.9 kg)   LMP 02/19/2022   BMI 26.31 kg/m  Physical Exam Gen: NAD, alert, cooperative with exam, well-appearing MSK:  Neurovascularly intact       ASSESSMENT & PLAN:   Shoulder dislocation, left, initial encounter Acutely occurring.  Having pain around the joint globally.  Shoulder is in place today. -Counseled on home exercise therapy and supportive care. -X-ray. -Sling provided. -Could consider injection.

## 2022-03-08 NOTE — Patient Instructions (Signed)
Good to see you I will call with the xray results.  Please use the sling  Please use heat on the muscles and ice on the joint  Please send me a message in MyChart with any questions or updates.  Please see me back in 3 weeks.   --Dr. Raeford Razor

## 2022-03-09 ENCOUNTER — Telehealth: Payer: Self-pay | Admitting: Family Medicine

## 2022-03-09 ENCOUNTER — Encounter: Payer: Medicaid Other | Admitting: Physical Therapy

## 2022-03-09 NOTE — Telephone Encounter (Signed)
Left VM for patient. If she calls back please have her speak with a nurse/CMA and inform that her xrays are normal.   If any questions then please take the best time and phone number to call and I will try to call her back.   Myra Rude, MD [facility] 03/09/2022, 10:37 AM

## 2022-03-11 ENCOUNTER — Ambulatory Visit: Payer: Medicare Other

## 2022-03-11 DIAGNOSIS — R2681 Unsteadiness on feet: Secondary | ICD-10-CM | POA: Diagnosis not present

## 2022-03-11 DIAGNOSIS — R296 Repeated falls: Secondary | ICD-10-CM

## 2022-03-11 DIAGNOSIS — R2689 Other abnormalities of gait and mobility: Secondary | ICD-10-CM

## 2022-03-11 DIAGNOSIS — R42 Dizziness and giddiness: Secondary | ICD-10-CM

## 2022-03-11 NOTE — Therapy (Signed)
OUTPATIENT PHYSICAL THERAPY VESTIBULAR TREATMENT NOTE   Patient Name: Suzanne Fletcher MRN: [id number] DOB:04/30/1991, 31 y.o., female Today's Date: 03/11/2022  PCP: Mayra Neer, MD REFERRING PROVIDER: [name], MD    PT End of Session - 03/11/22 0809     Visit Number 4    Number of Visits 13    Date for PT Re-Evaluation 05/04/22   due to potential delay in scheduling   Authorization Type Healthy [facility]    Authorization Time Period 5 PT visits 02/25/2022 - 04/25/2022    Authorization - Visit Number 4    Authorization - Number of Visits 5    PT Start Time 0810   patient arriving late   PT Stop Time 0846    PT Time Calculation (min) 36 min    Activity Tolerance Patient tolerated treatment well    Behavior During Therapy [facility] for tasks assessed/performed              Past Medical History:  Diagnosis Date   Asthma    Blind in both eyes    Migraine    Seizures ([place])    Past Surgical History:  Procedure Laterality Date   EYE SURGERY     Patient Active Problem List   Diagnosis Date Noted   Shoulder dislocation, left, initial encounter 03/08/2022   Hip subluxation, left, initial encounter ([place]) 02/22/2022   Metatarsalgia of both feet 02/22/2022   Benign paroxysmal positional vertigo of left ear 01/25/2022   Subluxation of left patella 01/25/2022   Acute midline thoracic back pain 12/16/2021   Keeps losing balance 12/16/2021   POTS (postural orthostatic tachycardia syndrome) 05/21/2021   Locking of right knee 03/18/2021   Cervical strain 03/04/2021   Sprain of metacarpophalangeal joint of left little finger 03/04/2021   Carpal tunnel syndrome 10/08/2020   Shoulder subluxation, left, initial encounter 08/20/2020   Leg cramps 07/16/2020   Right foot drop 07/16/2020   Vitamin D deficiency 07/16/2020   Subluxation of right patella 06/17/2020   Shoulder subluxation, right, subsequent encounter 06/17/2020   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 Reports had x-ray on the shoulder but it was normal. Patient reports MD thought maybe the nerve in the neck. Reports about 8 falls. Reports 3 falls last night. Reports got dizzy and went down.   PAIN:  Are you having pain? Yes: NPRS scale: 6/10 Pain location: L shoulder. Pain description: Hervey Ard Also reports headache at about 7/10.    OBJECTIVE:  VESTIBULAR TREATMENT:    Gaze Adaptation: x1 Viewing Horizontal: Position: seated, Time: x 30 secs, x 45 seconds, and Reps: 2, mild dizziness  and x1 Viewing Vertical:  Position: seated, Time: x 30 secs, x 45 seconds and Reps: 2. Mild Dizziness.   Saccades: seated completed horizontal saccades, completed x 8 reps with moderate dizziness, increased postural sway noted. Required seated rest break for symptom improvement prior to completion of second set. Completed additional x reps  Habituation: Seated Horizontal Head Movements: seated with  eyes open completed x 5 reps. Mild dizziness. Progressed to standing. Seated Vertical Head Movements: seated with eyes open completed x 5 reps, Mild dizziness. Progressed to standing.  Standing Balance: Surface: Floor Position: Narrow Base of Support Feet Hip Width Apart Completed with: Eyes Open; Head Turns x 10 Reps and Head Nods x 10 Reps, all completed with eyes open. Then transitioned to completing romberg with eyes closed, 3 x 30 seconds. Increased postural sway noted.  Use of  light UE support on counter top to help with sense of awareness and input.   Alternating Step Taps: Standing on firm surface completed alternating toe taps to cone x 10 reps with single UE support on R, then progressed to x 10 reps without UE support to promote improved balance and SLS. CGA required. Intermittent rest breaks throughout activities due to dizziness.      PATIENT EDUCATION: Education details: Progress VOR to 45 seconds Person educated: Patient and Pt's mom Education method: Explanation and Handout  Education comprehension: verbalized and demonstrated understanding    HOME EXERCISE PROGRAM:   Access Code: [email or web address]   GOALS: Goals reviewed with patient? Yes   SHORT TERM GOALS: Target date: 03/03/2022   Pt will be independent with initial HEP for vestibular and balance deficits with mom's supervision in order to build upon functional gains made in therapy.   Baseline: HEP for VOR and standing balance  Goal status: MET   2.  Pt will undergo further assessment of BERG with LTG written. Baseline: 47/56 Goal status: MET   3.  Pt will perform 5 reps of sitting head nods and head turns with moderate dizziness (3 on MSQ) or less in order to demo improved motion sensitivity.  Baseline: mild dizziness on 03/04/22 Goal status: MET   4.  Pt will report </= 3/5 for all movements on MSQ to indicate improvement in motion sensitivity and improved activity tolerance.   Baseline: 2-4/5 Goal status: MET       LONG TERM GOALS: Target date: 03/31/2022   Pt will be independent with final HEP for vestibular and balance deficits with mom's supervision in order to build upon functional gains made in therapy. Baseline: No HEP  Goal status: INITIAL   2.  Pt will improve Berg Balance to >/= 51/56 to demonstrate improved standing balance and reduced fall risk      Baseline: Not yet assessed.  Goal status: REVISED   3.  Pt will improve DPS to at least a 56% in order to demo improved  functional outcomes. Baseline: 48%           Goal status: INITIAL   4.  Pt will subjectively report being able to tolerate at least 30 minutes of video games with minimal to no dizziness. Baseline: Pt reports incr dizziness episodes while playing video games.  Goal status: INITIAL   5.  Pt will perform all items of MSQ with 2 (mild) or less dizziness in order to demo improved motion sensitivity. Baseline: Scores ranging from mild-severe, with pt needing further assessment of remainder of items.  Goal status: INITIAL     ASSESSMENT:   CLINICAL IMPRESSION: Continued VOR review with patient able to tolerate progression today to 45 seconds. Increased symptoms with saccades therefore withheld. Rest of session focused on NMR and balance to promote improved static standing. Continue to demo increased postural sway, most notable in posterior direction. Will continue to progress toward all LTGs.      OBJECTIVE IMPAIRMENTS Abnormal gait, cardiopulmonary status  limiting activity, decreased activity tolerance, decreased balance, difficulty walking, decreased strength, dizziness, impaired vision/preception, postural dysfunction, and pain.    ACTIVITY LIMITATIONS community activity, meal prep, and playing video games .    PERSONAL FACTORS Behavior pattern, Past/current experiences, Time since onset of injury/illness/exacerbation, and POTS (diagnosed 09/2021), blind in both eyes, hx of migraines (is followed by neurology at [facility] hx of seizures, longstanding hx of dizziness.  are also affecting patient's functional outcome.      REHAB POTENTIAL: Fair due to chronicity of condition   CLINICAL DECISION MAKING: Evolving/moderate complexity   EVALUATION COMPLEXITY: Moderate     PLAN: PT FREQUENCY: 1-2x/week   PT DURATION: 12 weeks   PLANNED INTERVENTIONS: Therapeutic exercises, Therapeutic activity, Neuromuscular re-education, Balance training, Gait training, Patient/Family education, Stair  training, Vestibular training, Canalith repositioning, Visual/preceptual remediation/compensation, DME instructions, and Manual therapy   PLAN FOR NEXT SESSION: Pt has a super cute golden retriever seeing eye dog. Pt also treated in dim lit room. Review VOR and progress time as needed. Will need to check goals + resubmit for additional MCD authorization? Standing balance with SLS/narrow BOS, oculomotor exercises (within pt's visual field), head motions, habituation to bending and turning.    Managed medicaid CPT codes: [phone]- Therapeutic Exercise, [phone]- Neuro Re-education, [phone] - Gait Training, [phone] - Manual Therapy, [phone] - Therapeutic Activities, [phone] - Self Care, and [place], PT, DPT 03/11/2022, 8:57 AM

## 2022-03-18 ENCOUNTER — Ambulatory Visit: Payer: Medicaid Other | Admitting: Family Medicine

## 2022-03-18 ENCOUNTER — Ambulatory Visit (HOSPITAL_COMMUNITY)
Admission: RE | Admit: 2022-03-18 | Discharge: 2022-03-18 | Disposition: A | Discharge status: Home / Self Care | Payer: Medicare Other | Source: Ambulatory Visit | Attending: Family Medicine | Admitting: Family Medicine

## 2022-03-18 DIAGNOSIS — Z79890 Hormone replacement therapy (postmenopausal): Secondary | ICD-10-CM | POA: Insufficient documentation

## 2022-03-18 DIAGNOSIS — I371 Nonrheumatic pulmonary valve insufficiency: Secondary | ICD-10-CM | POA: Insufficient documentation

## 2022-03-18 DIAGNOSIS — M357 Hypermobility syndrome: Secondary | ICD-10-CM | POA: Insufficient documentation

## 2022-03-18 LAB — ECHOCARDIOGRAM COMPLETE
Area-P 1/2: 4.15 cm2
Calc EF: 67.7 %
S' Lateral: 2.3 cm
Single Plane A2C EF: 61.6 %
Single Plane A4C EF: 71.1 %

## 2022-03-18 NOTE — Progress Notes (Signed)
  Echocardiogram 2D Echocardiogram has been performed.  Tye Savoy 03/18/2022, 2:51 PM

## 2022-03-19 ENCOUNTER — Ambulatory Visit: Payer: Medicare Other | Admitting: Physical Therapy

## 2022-03-25 ENCOUNTER — Ambulatory Visit: Payer: Medicare Other | Admitting: Physical Therapy

## 2022-03-25 ENCOUNTER — Encounter: Payer: Self-pay | Admitting: Physical Therapy

## 2022-03-25 DIAGNOSIS — R2681 Unsteadiness on feet: Secondary | ICD-10-CM | POA: Diagnosis not present

## 2022-03-25 DIAGNOSIS — R296 Repeated falls: Secondary | ICD-10-CM

## 2022-03-25 DIAGNOSIS — R42 Dizziness and giddiness: Secondary | ICD-10-CM

## 2022-03-25 DIAGNOSIS — R2689 Other abnormalities of gait and mobility: Secondary | ICD-10-CM

## 2022-03-25 NOTE — Therapy (Signed)
OUTPATIENT PHYSICAL THERAPY VESTIBULAR TREATMENT NOTE   Patient Name: Suzanne Fletcher MRN: [id number] DOB:04-Apr-1991, 31 y.o., female Today's Date: 03/25/2022  PCP: Mayra Neer, MD REFERRING PROVIDER: [name], MD    PT End of Session - 03/25/22 [phone]     Visit Number 5    Number of Visits 13    Date for PT Re-Evaluation 05/04/22   due to potential delay in scheduling   Authorization Type [facility]    Authorization Time Period 5 PT visits 02/25/2022 - 04/25/2022    Authorization - Visit Number 4    Authorization - Number of Visits 5    PT Start Time [phone]   pt late to session   PT Stop Time 0845    PT Time Calculation (min) 33 min    Activity Tolerance Patient tolerated treatment well    Behavior During Therapy [facility] for tasks assessed/performed              Past Medical History:  Diagnosis Date   Asthma    Blind in both eyes    Migraine    Seizures ([place])    Past Surgical History:  Procedure Laterality Date   EYE SURGERY     Patient Active Problem List   Diagnosis Date Noted   Shoulder dislocation, left, initial encounter 03/08/2022   Hip subluxation, left, initial encounter ([place]) 02/22/2022   Metatarsalgia of both feet 02/22/2022   Benign paroxysmal positional vertigo of left ear 01/25/2022   Subluxation of left patella 01/25/2022   Acute midline thoracic back pain 12/16/2021   Keeps losing balance 12/16/2021   POTS (postural orthostatic tachycardia syndrome) 05/21/2021   Locking of right knee 03/18/2021   Cervical strain 03/04/2021   Sprain of metacarpophalangeal joint of left little finger 03/04/2021   Carpal tunnel syndrome 10/08/2020   Shoulder subluxation, left, initial encounter 08/20/2020   Leg cramps 07/16/2020   Right foot drop 07/16/2020   Vitamin D deficiency 07/16/2020   Subluxation of right patella 06/17/2020   Shoulder subluxation, right, subsequent encounter 06/17/2020   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    ONSET  DATE: 01/25/2022  REFERRING DIAG: H81.12 (ICD-10-CM) - Benign paroxysmal positional vertigo of left ear   THERAPY DIAG:  Unsteadiness on feet  Repeated falls  Other abnormalities of gait and mobility  Dizziness and giddiness  Rationale for Evaluation and Treatment Rehabilitation  PERTINENT HISTORY: POTS (diagnosed 09/2021), blind in both eyes, hx of migraines (is followed by neurology at [facility]), hx of seizures  PRECAUTIONS: Other: blind in both eyes, has a seeing eye dog, fall   SUBJECTIVE: Reports her legs were spasming badly the other day and legs would visibly shake and not hold her up. Wasn't able to stand up on her own. Has had some falls since she was last here. Reports weather has been making vertigo a lot worse. Reports her mom had to catch her in the parking lot when she was coming in here.   PAIN:  Are you having pain? Yes: NPRS scale: 7/10 Pain location: L shoulder and "some other places" Pain description: Sharp    OBJECTIVE:  VESTIBULAR TREATMENT:   Saccades: seated completed horizontal saccades (with two X's taped on the back of mirror), completed x5 reps with mild dizziness. Performed 2 sets of 8 reps with moderate dizziness. Required seated rest break for symptom improvement prior to completion of second set. Less dizziness the 2nd time. Seated verticals saccades x5 reps with no symptoms, 2 sets of 8 reps (lower  side of moderate). Added saccades to HEP.   Standing Balance: With feet apart in corner, performed 3 sets of 30 seconds eyes closed. Pt with mild postural sway and tendency to lose balance posteriorly, needing wall for balance as needed. No dizziness, just unsteadiness.   With feet apart in corner EO; 2 sets of 5 reps head turns (mild/moderate dizziness), pt needing standing rest break between each set to allow symptoms to subside.   Alternating marching x10 reps each leg with use of BUE support on countertop, cued for holding for 5 seconds each for incr  SLS time.   PATIENT EDUCATION: Education details: Seated saccades in horizontal and vertical direction addition to HEP. Pt asking about process of getting a power w/c to help with mobility and help with falls. Discussed that pt would first need to get an order from her physician and have written documentation from her physician on why she would need one. Discussed that pt would then need a power w/c eval and then would have to see if insurance would cover one. PT discussed that she would talk to another PT that does w/c evals to get more information.  Person educated: Patient and Pt's mom Education method: Explanation and Handout  Education comprehension: verbalized and demonstrated understanding    HOME EXERCISE PROGRAM:   Access Code: [email or web address], Seated VOR for 45 seconds    GOALS: Goals reviewed with patient? Yes   SHORT TERM GOALS: Target date: 03/03/2022   Pt will be independent with initial HEP for vestibular and balance deficits with mom's supervision in order to build upon functional gains made in therapy.   Baseline: HEP for VOR and standing balance  Goal status: MET   2.  Pt will undergo further assessment of BERG with LTG written. Baseline: 47/56 Goal status: MET   3.  Pt will perform 5 reps of sitting head nods and head turns with moderate dizziness (3 on MSQ) or less in order to demo improved motion sensitivity.  Baseline: mild dizziness on 03/04/22 Goal status: MET   4.  Pt will report </= 3/5 for all movements on MSQ to indicate improvement in motion sensitivity and improved activity tolerance.   Baseline: 2-4/5 Goal status: MET       LONG TERM GOALS: Target date: 03/31/2022   Pt will be independent with final HEP for vestibular and balance deficits with mom's supervision in order to build upon functional gains made in therapy. Baseline: No HEP  Goal status: INITIAL   2.  Pt will improve Berg Balance to >/= 51/56 to demonstrate improved standing balance and reduced  fall risk      Baseline: Not yet assessed.  Goal status: REVISED   3.  Pt will improve DPS to at least a 56% in order to demo improved functional outcomes. Baseline: 48%           Goal status: INITIAL   4.  Pt will subjectively report being able to tolerate at least 30 minutes of video games with minimal to no dizziness. Baseline: Pt reports incr dizziness episodes while playing video games.  Goal status: INITIAL   5.  Pt will perform all items of MSQ with 2 (mild) or less dizziness in order to demo improved motion sensitivity. Baseline: Scores ranging from mild-severe, with pt needing further assessment of remainder of items.  Goal status: INITIAL     ASSESSMENT:   CLINICAL IMPRESSION: Pt late to session today due to weather so treatment was limited. Pt able  to tolerate seated saccades in the horizontal/vertical direction today for 8 reps each with mild/mod symptoms. Added to pt's HEP. Worked on corner balance with pt having more postural sway esp in the posterior direction with EC. Will continue to progress toward all LTGs.      OBJECTIVE IMPAIRMENTS Abnormal gait, cardiopulmonary status limiting activity, decreased activity tolerance, decreased balance, difficulty walking, decreased strength, dizziness, impaired vision/preception, postural dysfunction, and pain.    ACTIVITY LIMITATIONS community activity, meal prep, and playing video games .    PERSONAL FACTORS Behavior pattern, Past/current experiences, Time since onset of injury/illness/exacerbation, and POTS (diagnosed 09/2021), blind in both eyes, hx of migraines (is followed by neurology at [facility]), hx of seizures, longstanding hx of dizziness.  are also affecting patient's functional outcome.      REHAB POTENTIAL: Fair due to chronicity of condition   CLINICAL DECISION MAKING: Evolving/moderate complexity   EVALUATION COMPLEXITY: Moderate     PLAN: PT FREQUENCY: 1-2x/week   PT DURATION: 12 weeks   PLANNED  INTERVENTIONS: Therapeutic exercises, Therapeutic activity, Neuromuscular re-education, Balance training, Gait training, Patient/Family education, Stair training, Vestibular training, Canalith repositioning, Visual/preceptual remediation/compensation, DME instructions, and Manual therapy   PLAN FOR NEXT SESSION: Pt has a super cute golden retriever seeing eye dog. Pt also treated in dim lit room. Review VOR and progress time as needed. Check goals and will need to ask for more medicaid auth. Standing balance with SLS/narrow BOS, oculomotor exercises (within pt's visual field), head motions, habituation to bending and turning.    Managed medicaid CPT codes: [phone]- Therapeutic Exercise, [phone]- Neuro Re-education, [phone] - Gait Training, [phone] - Manual Therapy, [phone] - Therapeutic Activities, [phone] - Self Care, and [phone] - Canalith Repositioning   Arliss Journey, PT, DPT 03/25/2022, 9:40 AM

## 2022-03-31 ENCOUNTER — Ambulatory Visit: Payer: Medicaid Other | Admitting: Family Medicine

## 2022-03-31 ENCOUNTER — Ambulatory Visit: Payer: Medicare Other | Admitting: Physical Therapy

## 2022-04-06 ENCOUNTER — Other Ambulatory Visit: Payer: Self-pay | Admitting: Physician Assistant

## 2022-04-15 ENCOUNTER — Ambulatory Visit: Payer: Medicaid Other

## 2022-04-18 ENCOUNTER — Other Ambulatory Visit: Payer: Self-pay | Admitting: Family Medicine

## 2022-04-18 DIAGNOSIS — S83001D Unspecified subluxation of right patella, subsequent encounter: Secondary | ICD-10-CM

## 2022-04-18 DIAGNOSIS — S43001A Unspecified subluxation of right shoulder joint, initial encounter: Secondary | ICD-10-CM

## 2022-05-19 ENCOUNTER — Ambulatory Visit: Payer: Medicaid Other | Admitting: Family Medicine

## 2022-05-19 ENCOUNTER — Encounter: Payer: Self-pay | Admitting: Family Medicine

## 2022-05-19 ENCOUNTER — Ambulatory Visit (HOSPITAL_BASED_OUTPATIENT_CLINIC_OR_DEPARTMENT_OTHER)
Admission: RE | Admit: 2022-05-19 | Discharge: 2022-05-19 | Disposition: A | Discharge status: Home / Self Care | Payer: Medicare Other | Source: Ambulatory Visit | Attending: Family Medicine | Admitting: Family Medicine

## 2022-05-19 VITALS — BP 100/70 | Ht 66.0 in | Wt 163.0 lb

## 2022-05-19 DIAGNOSIS — M5416 Radiculopathy, lumbar region: Secondary | ICD-10-CM | POA: Diagnosis not present

## 2022-05-19 DIAGNOSIS — M25872 Other specified joint disorders, left ankle and foot: Secondary | ICD-10-CM

## 2022-05-19 DIAGNOSIS — M533 Sacrococcygeal disorders, not elsewhere classified: Secondary | ICD-10-CM | POA: Insufficient documentation

## 2022-05-19 MED ORDER — GABAPENTIN 300 MG PO CAPS: 300.0000 mg | ORAL_CAPSULE | Freq: Three times a day (TID) | ORAL | 1 refills | Status: DC

## 2022-05-19 NOTE — Progress Notes (Signed)
  Suzanne Fletcher - 31 y.o. female MRN [id number]  Date of birth: [birth year]  SUBJECTIVE:  Including CC & ROS.  No chief complaint on file.   Suzanne Fletcher is a 31 y.o. female that is presenting with acute radicular type pain as well as right sided low back pain.  She had a fall in July.  Having acute left ankle pain as well.    Review of Systems See HPI   HISTORY: Past Medical, Surgical, Social, and Family History Reviewed & Updated per EMR.   Pertinent Historical Findings include:  Past Medical History:  Diagnosis Date   Asthma    Blind in both eyes    Migraine    Seizures ([facility])     Past Surgical History:  Procedure Laterality Date   EYE SURGERY       PHYSICAL EXAM:  VS: BP 100/70 (BP Location: Left Arm, Patient Position: Sitting)   Ht 5\' 6"  (1.676 m)   Wt 163 lb (73.9 kg)   BMI 26.31 kg/m  Physical Exam Gen: NAD, alert, cooperative with exam, well-appearing MSK:  Neurovascularly intact       ASSESSMENT & PLAN:   Lumbar radiculopathy Acutely occurring down the right leg.  Severe in nature.  She has had a few different falls this summer. -Counseled on home exercise therapy and supportive care. - Gabapentin. -X-ray. -Could consider further imaging or SI joint injection.   Coccydynia Acutely occurring.  She has had a few different falls this summer. -Counseled on home exercise therapy and supportive care. -X-ray. -Could consider physical therapy.  Ankle impingement syndrome, left Having acute swelling and pain.  Has had a few falls.  Her hypermobility is contributing. -Counseled on home exercise therapy and supportive care. -X-ray. -Could consider orthotics or physical therapy.

## 2022-05-19 NOTE — Assessment & Plan Note (Signed)
Acutely occurring.  She has had a few different falls this summer. -Counseled on home exercise therapy and supportive care. -X-ray. -Could consider physical therapy.

## 2022-05-19 NOTE — Assessment & Plan Note (Signed)
Having acute swelling and pain.  Has had a few falls.  Her hypermobility is contributing. -Counseled on home exercise therapy and supportive care. -X-ray. -Could consider orthotics or physical therapy.

## 2022-05-19 NOTE — Patient Instructions (Signed)
Good to see you Please try heat  Please start with 1 gabapentin at night.  You can increase to 2-3 times daily as you tolerate. I will call with the x-ray results Please send me a message in MyChart with any questions or updates.  Please see me back in 4-6 weeks.   --Dr. Jordan Likes

## 2022-05-19 NOTE — Assessment & Plan Note (Signed)
Acutely occurring down the right leg.  Severe in nature.  She has had a few different falls this summer. -Counseled on home exercise therapy and supportive care. - Gabapentin. -X-ray. -Could consider further imaging or SI joint injection.

## 2022-05-21 ENCOUNTER — Telehealth: Payer: Self-pay | Admitting: Family Medicine

## 2022-05-21 NOTE — Telephone Encounter (Signed)
Left VM for patient. If she calls back please have her speak with a nurse/CMA and inform that her xrays are normal. The [employer] rep should be contacting about the back brace.   If any questions then please take the best time and phone number to call and I will try to call her back.   Myra Rude, MD [facility] 05/21/2022, 12:20 PM

## 2022-05-24 ENCOUNTER — Ambulatory Visit: Payer: Medicaid Other | Admitting: Family Medicine

## 2022-06-06 IMAGING — DX DG SHOULDER 2+V*L*
3 series · 3 of 3 positions shown · non-contrast
Comparison: Left shoulder radiographs 01/25/2022 and 12/02/2020

CLINICAL DATA: Left shoulder dislocation status post injury over
the weekend. History of prior left shoulder dislocations.

EXAM:
LEFT SHOULDER - 2+ VIEW

[shoulder grashey]
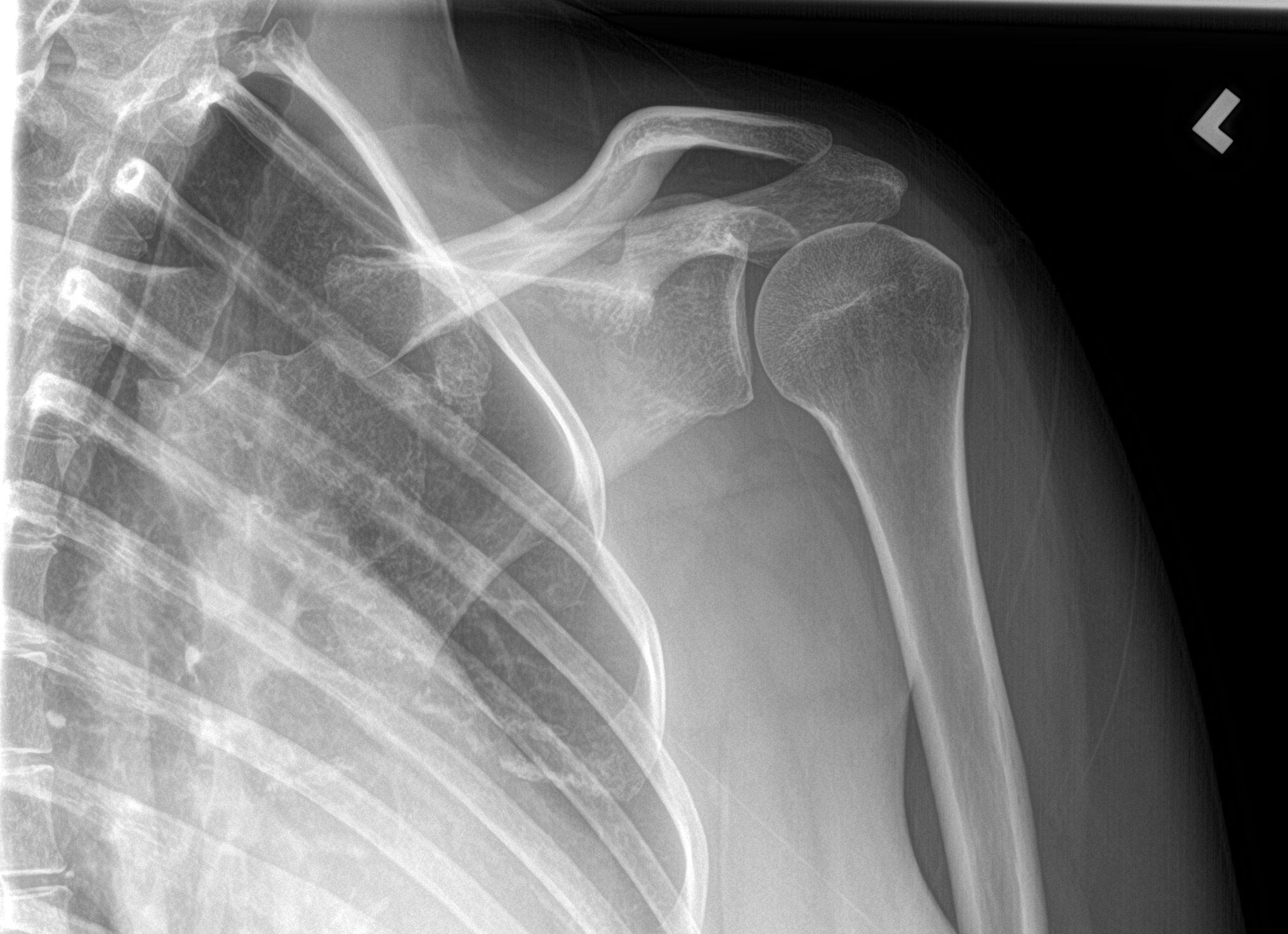

[shoulder y view]
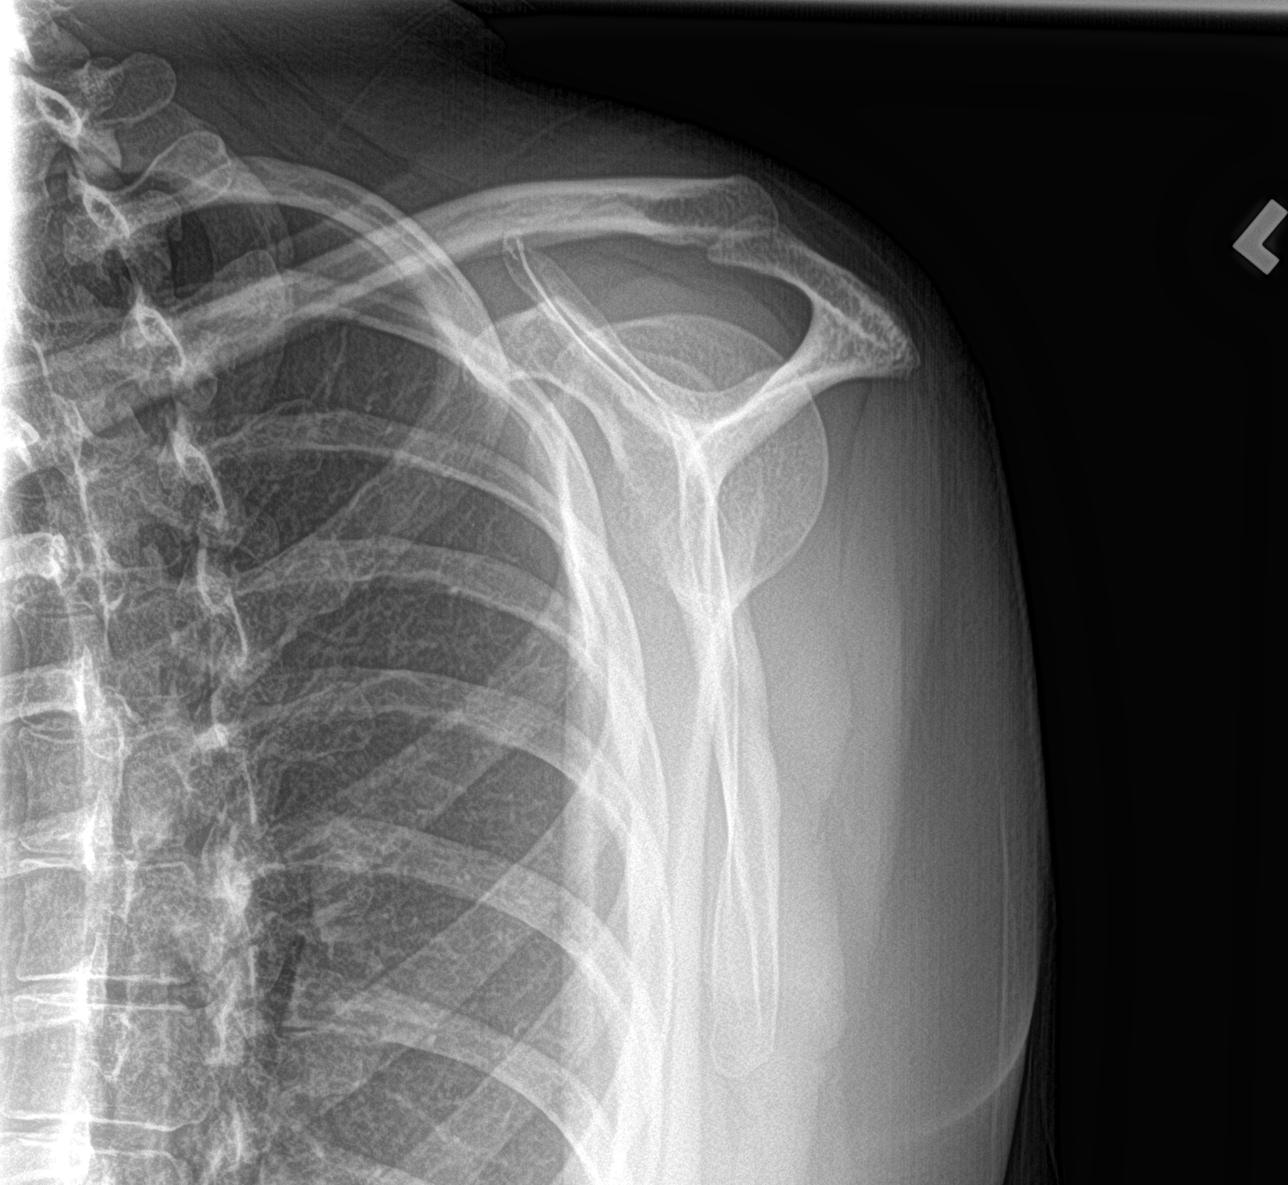

[shoulder ap neutral]
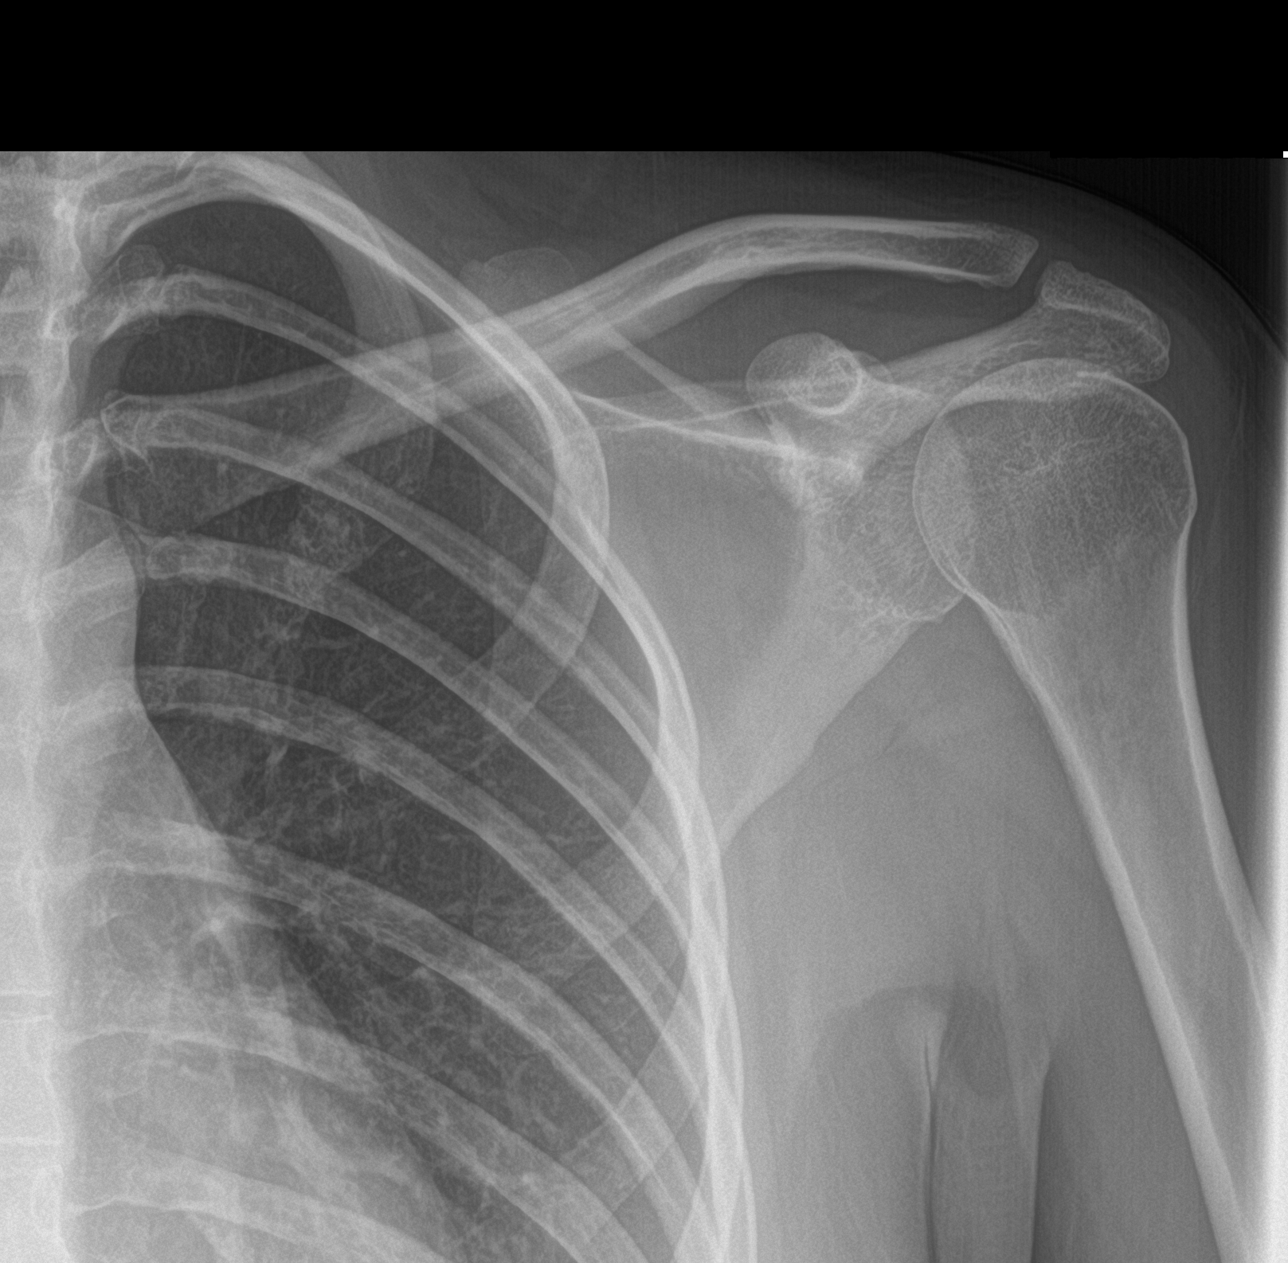

[3 of 3 positions shown; findings below may reference images not displayed]

FINDINGS: Normal bone mineralization. The glenohumeral acromioclavicular joint
spaces are maintained. Normal alignment. No acute fracture or
dislocation. The visualized portion of the left lung is
unremarkable.
IMPRESSION: Normal left shoulder radiographs.

## 2022-06-28 ENCOUNTER — Other Ambulatory Visit: Payer: Self-pay | Admitting: Family Medicine

## 2022-06-28 ENCOUNTER — Ambulatory Visit: Payer: Medicaid Other | Admitting: Family Medicine

## 2022-06-28 DIAGNOSIS — S43001A Unspecified subluxation of right shoulder joint, initial encounter: Secondary | ICD-10-CM

## 2022-06-28 DIAGNOSIS — S83001D Unspecified subluxation of right patella, subsequent encounter: Secondary | ICD-10-CM

## 2022-07-19 ENCOUNTER — Other Ambulatory Visit: Payer: Self-pay | Admitting: Family Medicine

## 2022-08-09 ENCOUNTER — Other Ambulatory Visit: Payer: Self-pay | Admitting: Cardiology

## 2022-08-24 ENCOUNTER — Other Ambulatory Visit: Payer: Self-pay | Admitting: Family Medicine

## 2022-08-24 DIAGNOSIS — S43001A Unspecified subluxation of right shoulder joint, initial encounter: Secondary | ICD-10-CM

## 2022-08-24 DIAGNOSIS — S83001D Unspecified subluxation of right patella, subsequent encounter: Secondary | ICD-10-CM

## 2022-09-20 ENCOUNTER — Ambulatory Visit: Payer: Medicaid Other | Admitting: Family Medicine

## 2022-09-20 ENCOUNTER — Encounter: Payer: Self-pay | Admitting: Family Medicine

## 2022-09-20 VITALS — BP 112/64 | Ht 66.0 in | Wt 160.0 lb

## 2022-09-20 DIAGNOSIS — M5416 Radiculopathy, lumbar region: Secondary | ICD-10-CM | POA: Diagnosis present

## 2022-09-20 DIAGNOSIS — R2689 Other abnormalities of gait and mobility: Secondary | ICD-10-CM

## 2022-09-20 MED ORDER — ONDANSETRON HCL 4 MG PO TABS: 4.0000 mg | ORAL_TABLET | Freq: Two times a day (BID) | ORAL | 1 refills | Status: AC | PRN

## 2022-09-20 NOTE — Assessment & Plan Note (Signed)
Acute on chronic in nature.  She reports episodes of inability to function after sitting down.  She also has episodes of feeling that she is going to fall.  This seems to be increasingly getting worse over the past few months.  No history of surgery.  Unclear if this is a spinal mediated problem or a peripheral.  Reports a history of nerve study that was normal.   -Counseled on home exercise therapy and supportive care. -Pursue EMG. -May need to consider nerve biopsy versus lab testing such as tickborne or receptor mediated.

## 2022-09-20 NOTE — Assessment & Plan Note (Addendum)
Acute on chronic in nature.  Continues to have pain in the lower back as well as radicular fashion.  Seems to be getting worse since July.  She has had greater than 6 weeks of physician directed home exercise therapy.  Imaging has been negative thus far. -Counseled on home exercise therapy and supportive care. -Zofran due to nausea from pain -MRI of the lumbar spine to evaluate for nerve impingement consideration of epidural use.

## 2022-09-20 NOTE — Progress Notes (Signed)
  Suzanne Fletcher - 31 y.o. female MRN [id number]  Date of birth: 1990-11-22  SUBJECTIVE:  Including CC & ROS.  No chief complaint on file.   Suzanne Fletcher is a 31 y.o. female that is presenting with acute worsening of her low back and leg pain with intermittent occurrences of loss of function of her lower extremities.  Ongoing since July.  Initially these instances of lower extremity malfunction were occurring once a week but are coming at least 5 days a week now.  No history of lower back surgery or pelvic surgery.  Unsure if she has ever had a tick exposure.  No upper body experiences of the same nature.  Did get initial relief with gabapentin.  Having to use walking sticks intermittently.    Review of Systems See HPI   HISTORY: Past Medical, Surgical, Social, and Family History Reviewed & Updated per EMR.   Pertinent Historical Findings include:  Past Medical History:  Diagnosis Date   Asthma    Blind in both eyes    Migraine    Seizures ([facility])     Past Surgical History:  Procedure Laterality Date   EYE SURGERY       PHYSICAL EXAM:  VS: BP 112/64   Ht 5\' 6"  (1.676 m)   Wt 160 lb (72.6 kg)   BMI 25.82 kg/m  Physical Exam Gen: NAD, alert, cooperative with exam, well-appearing MSK:  Neurovascularly intact       ASSESSMENT & PLAN:   Lumbar radiculopathy Acute on chronic in nature.  Continues to have pain in the lower back as well as radicular fashion.  Seems to be getting worse since July.  She has had greater than 6 weeks of physician directed home exercise therapy.  Imaging has been negative thus far. -Counseled on home exercise therapy and supportive care. -Zofran due to nausea from pain -MRI of the lumbar spine to evaluate for nerve impingement consideration of epidural use.  Keeps losing balance Acute on chronic in nature.  She reports episodes of inability to function after sitting down.  She also has episodes of feeling that she is going to fall.  This  seems to be increasingly getting worse over the past few months.  No history of surgery.  Unclear if this is a spinal mediated problem or a peripheral.  Reports a history of nerve study that was normal.   -Counseled on home exercise therapy and supportive care. -Pursue EMG. -May need to consider nerve biopsy versus lab testing such as tickborne or receptor mediated.

## 2022-09-20 NOTE — Patient Instructions (Signed)
Good to see you You can increase the gabapentin as you tolerate  We'll get the MRI at [facility] imaging  Please use the zofran as needed  Let me know about the nerve study   Please send me a message in MyChart with any questions or updates. We'll setup a virtual visit once the MRi is resulted.   --Dr. Jordan Likes

## 2022-10-11 ENCOUNTER — Ambulatory Visit
Admission: RE | Admit: 2022-10-11 | Discharge: 2022-10-11 | Disposition: A | Discharge status: Home / Self Care | Payer: Medicaid Other | Source: Ambulatory Visit | Attending: Family Medicine | Admitting: Family Medicine

## 2022-10-11 DIAGNOSIS — M5416 Radiculopathy, lumbar region: Secondary | ICD-10-CM

## 2022-10-13 ENCOUNTER — Encounter: Payer: Self-pay | Admitting: Family Medicine

## 2022-10-13 ENCOUNTER — Telehealth (INDEPENDENT_AMBULATORY_CARE_PROVIDER_SITE_OTHER): Payer: Medicaid Other | Admitting: Family Medicine

## 2022-10-13 VITALS — Ht 66.0 in | Wt 160.0 lb

## 2022-10-13 DIAGNOSIS — M5416 Radiculopathy, lumbar region: Secondary | ICD-10-CM

## 2022-10-13 NOTE — Progress Notes (Signed)
Virtual Visit via Video Note  I connected with Suzanne Fletcher on 10/13/22 at  1:30 PM EST by a video enabled telemedicine application and verified that I am speaking with the correct person using two identifiers.  Location: Patient: home Provider: office   I discussed the limitations of evaluation and management by telemedicine and the availability of in person appointments. The patient expressed understanding and agreed to proceed.  History of Present Illness:  Ms. Suzanne Fletcher is a 32 yo F that is following up for her right leg pain. Her MRI of the lumbar spine was revealing for mild facet hypertrophy in the L4-5 and l5-S1. Continues to have burning in the right leg.     Observations/Objective:   Assessment and Plan:  Lumbar Radiculopathy:  She continues to have pain and burning in her right foot. MRI was reassuring.  - counseled on home exercise therapy and supportive care - pursue EMG/nerve study  Follow Up Instructions:    I discussed the assessment and treatment plan with the patient. The patient was provided an opportunity to ask questions and all were answered. The patient agreed with the plan and demonstrated an understanding of the instructions.   The patient was advised to call back or seek an in-person evaluation if the symptoms worsen or if the condition fails to improve as anticipated.    Clearance Coots, MD

## 2022-10-13 NOTE — Addendum Note (Signed)
Addended by: Cresenciano Lick on: 10/13/2022 02:37 PM   Modules accepted: Orders

## 2022-10-13 NOTE — Assessment & Plan Note (Signed)
She continues to have pain and burning in her right foot. MRI was reassuring.  - counseled on home exercise therapy and supportive care - pursue EMG/nerve study

## 2022-10-17 ENCOUNTER — Other Ambulatory Visit: Payer: Self-pay | Admitting: Family Medicine

## 2022-10-17 DIAGNOSIS — S43001A Unspecified subluxation of right shoulder joint, initial encounter: Secondary | ICD-10-CM

## 2022-10-17 DIAGNOSIS — S83001D Unspecified subluxation of right patella, subsequent encounter: Secondary | ICD-10-CM

## 2022-10-26 ENCOUNTER — Telehealth: Payer: Self-pay | Admitting: *Deleted

## 2022-10-26 MED ORDER — GABAPENTIN 300 MG PO CAPS: ORAL_CAPSULE | ORAL | 2 refills | Status: DC

## 2022-10-26 NOTE — Telephone Encounter (Signed)
-----  Message from Elberta Spaniel sent at 10/26/2022  1:17 PM EST ----- Regarding: Patient req'd Gabapentin Rx refill Pt's mom cld  request refill for:  gabapentin (NEURONTIN) 300 MG capsule [[id number]]   Order Details Dose, Route, Frequency: As Directed Dispense Quantity: 90 capsule Refills: 1      Sig: TAKE 1 CAPSULE(300 MG) BY MOUTH THREE TIMES DAILY  ---Pls send refill order to:    [id number] [place] [facility] [address] [place] [address] Phone: [phone]  Fax: [phone]   Thx

## 2022-12-15 ENCOUNTER — Other Ambulatory Visit: Payer: Self-pay | Admitting: Family Medicine

## 2022-12-15 DIAGNOSIS — S83001D Unspecified subluxation of right patella, subsequent encounter: Secondary | ICD-10-CM

## 2022-12-15 DIAGNOSIS — S43001A Unspecified subluxation of right shoulder joint, initial encounter: Secondary | ICD-10-CM

## 2023-01-17 ENCOUNTER — Encounter: Payer: Self-pay | Admitting: *Deleted

## 2023-02-13 ENCOUNTER — Other Ambulatory Visit: Payer: Self-pay | Admitting: Family Medicine

## 2023-02-13 DIAGNOSIS — S43001A Unspecified subluxation of right shoulder joint, initial encounter: Secondary | ICD-10-CM

## 2023-02-13 DIAGNOSIS — S83001D Unspecified subluxation of right patella, subsequent encounter: Secondary | ICD-10-CM

## 2023-04-26 ENCOUNTER — Other Ambulatory Visit: Payer: Self-pay | Admitting: *Deleted

## 2023-04-26 DIAGNOSIS — S43001A Unspecified subluxation of right shoulder joint, initial encounter: Secondary | ICD-10-CM

## 2023-04-26 DIAGNOSIS — S83001D Unspecified subluxation of right patella, subsequent encounter: Secondary | ICD-10-CM

## 2023-04-26 MED ORDER — CELECOXIB 200 MG PO CAPS: 200.0000 mg | ORAL_CAPSULE | Freq: Two times a day (BID) | ORAL | 1 refills | Status: DC

## 2023-04-26 MED ORDER — GABAPENTIN 300 MG PO CAPS: ORAL_CAPSULE | ORAL | 1 refills | Status: DC

## 2023-05-05 ENCOUNTER — Other Ambulatory Visit: Payer: Self-pay | Admitting: Cardiology

## 2023-07-25 ENCOUNTER — Telehealth: Payer: Self-pay

## 2023-07-25 NOTE — Telephone Encounter (Signed)
Patient's mother called states patient is out of her celebrex and gabapentin and would like to know if you can refill them until she sees you?

## 2023-07-27 NOTE — Telephone Encounter (Signed)
Scheduled on 10/25 at 12:00pm, per Dr. Lenord Fellers.

## 2023-07-29 ENCOUNTER — Encounter: Payer: Self-pay | Admitting: Internal Medicine

## 2023-07-29 ENCOUNTER — Other Ambulatory Visit: Payer: Self-pay | Admitting: Internal Medicine

## 2023-07-29 ENCOUNTER — Ambulatory Visit (INDEPENDENT_AMBULATORY_CARE_PROVIDER_SITE_OTHER): Payer: Medicare Other | Admitting: Internal Medicine

## 2023-07-29 VITALS — BP 100/80 | HR 124 | Ht 66.0 in | Wt 193.0 lb

## 2023-07-29 DIAGNOSIS — M5416 Radiculopathy, lumbar region: Secondary | ICD-10-CM | POA: Diagnosis not present

## 2023-07-29 DIAGNOSIS — G8929 Other chronic pain: Secondary | ICD-10-CM

## 2023-07-29 DIAGNOSIS — S83001D Unspecified subluxation of right patella, subsequent encounter: Secondary | ICD-10-CM

## 2023-07-29 DIAGNOSIS — M7918 Myalgia, other site: Secondary | ICD-10-CM

## 2023-07-29 DIAGNOSIS — S43001A Unspecified subluxation of right shoulder joint, initial encounter: Secondary | ICD-10-CM

## 2023-07-29 DIAGNOSIS — Z8669 Personal history of other diseases of the nervous system and sense organs: Secondary | ICD-10-CM

## 2023-07-29 MED ORDER — CELECOXIB 200 MG PO CAPS: 200.0000 mg | ORAL_CAPSULE | Freq: Two times a day (BID) | ORAL | 1 refills | Status: DC

## 2023-07-29 MED ORDER — GABAPENTIN 300 MG PO CAPS: ORAL_CAPSULE | ORAL | 0 refills | Status: DC

## 2023-07-29 NOTE — Progress Notes (Shared)
Patient Care Team: Lupita Raider, MD as PCP - General (Family Medicine) Lanier Prude, MD as PCP - Electrophysiology (Cardiology)  Visit Date: 07/29/23  Subjective:    Patient ID: Suzanne Fletcher , Female   DOB: 08/28/1991, 32 y.o.    MRN: [id number]   32 y.o. Female presents today for medication refill. He is accompanied by his mother. He has been unable to get medications refilled due to changing physicians. He plans to see a new neuromuscular specialist at [facility] for management of small fiber neuropathy and is currently scheduled to see his neurologist, Dr. Thomasena Edis on 09/19/23. He has been taking gabapentin 300 mg three times daily for nerve pain in hands and feet. Taking Norco 5-325 mg every six hours as needed, nortriptyline 10 mg twice daily, celecoxib 200 mg twice daily for intermittent mid-lower back pain that radiates into legs. Taking baclofen for muscle spasms in his back. He is having daily episodes of debilitating pain. 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 10/11/22 MRI lumbar spine showed: 1) Mild bilateral facet hypertrophy at L4-5 and L5-S1, 2) Otherwise unremarkable and normal MRI of the lumbar spine. No significant disc pathology, stenosis, or evidence for neural impingement. Requesting refills of celecoxib and gabapentin.  Seen by neurologist, Dr. Thomasena Edis for migraine headaches and is on Vyepti injections with adequate relief. He is on Keppra for seizures. His first seizure occurred at age 20 and the most recent was in 07/2022. Seizures last minutes.  B12, TSH, FT4, HGB, HCT normal in 06/2023.  Past Medical History:  Diagnosis Date   Asthma    Blind in both eyes    Migraine    Seizures (HCC)      Family History  Problem Relation Age of Onset   Diabetes Mother    Hypertension Mother     Social History   Social History Narrative   Not on file      Review of Systems  Constitutional:  Negative  for fever and malaise/fatigue.  HENT:  Negative for congestion.   Eyes:  Negative for blurred vision.  Respiratory:  Negative for cough and shortness of breath.   Cardiovascular:  Negative for chest pain, palpitations and leg swelling.  Gastrointestinal:  Negative for vomiting.  Musculoskeletal:  Positive for back pain.  Skin:  Negative for rash.  Neurological:  Positive for tingling (Hands, feet). Negative for loss of consciousness and headaches.        Objective:   Vitals: BP 100/80   Pulse (!) 124   Ht 5\' 6"  (1.676 m)   Wt 193 lb (87.5 kg)   SpO2 96%   BMI 31.15 kg/m    Physical Exam Vitals and nursing note reviewed.  Constitutional:      General: She is not in acute distress.    Appearance: Normal appearance. She is not toxic-appearing.  HENT:     Head: Normocephalic and atraumatic.  Neck:     Thyroid: No thyroid mass, thyromegaly or thyroid tenderness.     Vascular: No carotid bruit.  Cardiovascular:     Rate and Rhythm: Normal rate and regular rhythm. No extrasystoles are present.    Pulses: Normal pulses.     Heart sounds: Normal heart sounds. No murmur heard.    No friction rub. No gallop.  Pulmonary:     Effort: Pulmonary effort is normal. No respiratory distress.     Breath sounds: Normal breath sounds. No  wheezing or rales.  Skin:    General: Skin is warm and dry.  Neurological:     Mental Status: She is alert and oriented to person, place, and time. Mental status is at baseline.  Psychiatric:        Mood and Affect: Mood normal.        Behavior: Behavior normal.        Thought Content: Thought content normal.        Judgment: Judgment normal.       Results:   Studies obtained and personally reviewed by me:  10/11/22 MRI lumbar spine showed: 1) Mild bilateral facet hypertrophy at L4-5 and L5-S1, 2) Otherwise unremarkable and normal MRI of the lumbar spine. No significant disc pathology, stenosis, or evidence for neural impingement.   Labs:        Component Value Date/Time   NA 135 03/27/2019 0759   K 3.2 (L) 03/27/2019 0759   CL 104 03/27/2019 0759   CO2 21 (L) 03/27/2019 0759   GLUCOSE 112 (H) 03/27/2019 0759   BUN 7 03/27/2019 0759   CREATININE 0.77 03/27/2019 0759   CALCIUM 8.7 (L) 03/27/2019 0759   PROT 7.3 03/27/2019 0759   ALBUMIN 3.9 03/27/2019 0759   AST 19 03/27/2019 0759   ALT 16 03/27/2019 0759   ALKPHOS 45 03/27/2019 0759   BILITOT 0.6 03/27/2019 0759   GFRNONAA >60 03/27/2019 0759   GFRAA >60 03/27/2019 0759     Lab Results  Component Value Date   WBC 6.5 03/27/2019   HGB 10.4 (L) 03/27/2019   HCT 33.0 (L) 03/27/2019   MCV 85.3 03/27/2019   PLT 212 03/27/2019    No results found for: "CHOL", "HDL", "LDLCALC", "LDLDIRECT", "TRIG", "CHOLHDL"  No results found for: "HGBA1C"   Lab Results  Component Value Date   TSH 0.829 03/27/2019      Assessment & Plan:   Small fiber neuropathy: refilled gabapentin. Seen by neurologist, Dr. Thomasena Edis.  Back pain: refilled celecoxib.   We have agreed to refill these medications with the understanding that future prescription management will be done through specialists in his care team.    I,Alexander Ruley,acting as a scribe for Margaree Mackintosh, MD.,have documented all relevant documentation on the behalf of Margaree Mackintosh, MD,as directed by  Margaree Mackintosh, MD while in the presence of Margaree Mackintosh, MD.   ***

## 2023-07-31 NOTE — Patient Instructions (Addendum)
I have refilled Celebrex 200 mg twice daily for 90 days at mother and patient request.  I have refilled gabapentin for 1 month at a dose of 300 mg by mouth 3 times daily.  Mother reports that patient will be seeing Dr. Thomasena Edis, neurologist at [facility] in the near future.  Also patient is under going gender affirming care with hormonal therapy at [facility].  Patient and mother advised that patient needs to find a primary care physician accepting new patients.

## 2023-09-23 ENCOUNTER — Other Ambulatory Visit: Payer: Self-pay | Admitting: Cardiology

## 2023-09-23 ENCOUNTER — Other Ambulatory Visit: Payer: Self-pay

## 2023-09-23 MED ORDER — GABAPENTIN 300 MG PO CAPS: ORAL_CAPSULE | ORAL | 0 refills | Status: DC

## 2023-10-27 ENCOUNTER — Other Ambulatory Visit: Payer: Self-pay | Admitting: Cardiology

## 2023-11-29 ENCOUNTER — Other Ambulatory Visit: Payer: Self-pay | Admitting: Internal Medicine

## 2023-12-30 ENCOUNTER — Other Ambulatory Visit: Payer: Self-pay | Admitting: Internal Medicine

## 2024-01-02 ENCOUNTER — Telehealth: Payer: Self-pay | Admitting: Cardiology

## 2024-01-02 ENCOUNTER — Other Ambulatory Visit (HOSPITAL_COMMUNITY): Payer: Self-pay

## 2024-01-02 ENCOUNTER — Encounter: Payer: Self-pay | Admitting: Internal Medicine

## 2024-01-02 ENCOUNTER — Other Ambulatory Visit: Payer: Self-pay | Admitting: Cardiology

## 2024-01-02 ENCOUNTER — Other Ambulatory Visit: Payer: Self-pay | Admitting: Internal Medicine

## 2024-01-02 MED ORDER — METOPROLOL SUCCINATE ER 50 MG PO TB24
50.0000 mg | ORAL_TABLET | Freq: Every day | ORAL | 1 refills | Status: DC
  Filled 2024-01-02: qty 30, 30d supply, fill #0

## 2024-01-02 MED ORDER — METOPROLOL SUCCINATE ER 50 MG PO TB24: 50.0000 mg | ORAL_TABLET | Freq: Every day | ORAL | 1 refills | Status: DC

## 2024-01-02 NOTE — Telephone Encounter (Signed)
 Called and spoke with Tammy, patients mother and reminded her that Dr Iona Hansen was not going to refill any more of below medication according to last note and that she was not going to be PCP. They were supposed to be looking for a new PCP according to last note. Note dated 07/28/2023. Mother verbalized understanding.  (We have agreed to refill these medications with the understanding that future prescription management will be done through specialists in his care team. Patient also needs to find another primary care provider that accepts  his insurance coverage and is taking new patients.  We saw the patient today on a one-time basis until patient and his mother can make further arrangements for primary care.  It seems that his primary care physician/sports medicine  physician left [place] according to patient's mother.)

## 2024-01-02 NOTE — Telephone Encounter (Signed)
*  STAT* If patient is at the pharmacy, call can be transferred to refill team.   1. Which medications need to be refilled? (please list name of each medication and dose if known)  Metoprolol   2. Would you like to learn more about the convenience, safety, & potential cost savings by using the [facility]?      3. Are you open to using the [facility] (Type [facility]. .   4. Which pharmacy/location (including street and city if local pharmacy) is medication to be sent to?[facility] [address] [place]   5. Do they need a 30 day or 90 day supply? 30 and refills

## 2024-01-02 NOTE — Telephone Encounter (Signed)
 Pt's medication was sent to pt's pharmacy as requested. Confirmation received.

## 2024-02-14 ENCOUNTER — Other Ambulatory Visit: Payer: Self-pay | Admitting: Cardiology

## 2024-02-21 NOTE — Progress Notes (Signed)
 Cardiology Office Note:  .   Date:  02/21/2024  ID:  Suzanne Fletcher, DOB [birth year], MRN [id number] PCP: Glena Landau, MD  [facility] Providers Cardiologist:  None Electrophysiologist:  Boyce Byes, MD {  History of Present Illness: .   Suzanne Fletcher is a 33 y.o. female w/PMHx of  asthma, migraines, blindness (since elementary age), seizure d/o, hypermobility  Dysautonomia   She was referred to Dr. Marven Slimmer for tachycardia, last seen by him 08/07/21.  Reportedly she has some degree of fast HRs at all times, though at times and with minimal exertion rapidly rises.  Some associated/episodes of dizziness, no syncope. Suspected there was some unknown underlying/unifying diagnosis with seizures, migraines, tachycardia.  Suspected a component dysautonomia planned to start low dose BB, discussed Ivabradine was an option if intolerant to BB. Discussed salt liberalization, hydration, compression wear and continued f/u with her [facility] Neurologist  Seeing myself and Dr. Marven Slimmer since then on a few occasions.  Most recently Dr. Marven Slimmer 02/23/22, improvement in her heart rate this week, but she had some higher rates last week. Per her smart watch, her average heart rate is about 90-105 bpm. With exertion her HR may climb to 130-140 bpm. At night, her watch has recorded her heart rate as low as 50-60 bpm but averaging in the 70's. She remains compliant with metoprolol .  Overall this was improved heart rate from our earlier visits. Recommended continuing metoprolol  at this time. I recommended that she pursue compression stockings from a medical supply store. She should continue to stay active.  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  This has resolved but worries about any damage/issues it may have cause   Studies Reviewed: Aaron Aas    EKG done today and reviewed by myself:  SR 94bpm   03/18/22: TTE  1. Left ventricular ejection fraction, by estimation, is 55 to 60%. The  left ventricle has normal function. The left ventricle has no regional  wall motion abnormalities. Left ventricular diastolic parameters were  normal. The average left ventricular  global longitudinal strain is -22.8 %. The global longitudinal strain is  normal.   2. Right ventricular systolic function is normal. The right ventricular  size is normal.   3. The mitral valve is normal in structure. Trivial mitral valve  regurgitation.   4. The aortic valve is normal in structure. Aortic valve regurgitation is  not visualized.    Risk Assessment/Calculations:    Physical Exam:   VS:  There were no vitals taken for this visit.   Wt Readings from Last 3 Encounters:  07/29/23 193 lb (87.5 kg)  10/13/22 160 lb (72.6 kg)  09/20/22 160 lb (72.6 kg)    GEN: Well nourished, well developed in no acute distress NECK: No JVD; No carotid bruits CARDIAC: RRR, no murmurs, no rubs or gallops RESPIRATORY:  CTA b/l without rales, wheezing or rhonchi  ABDOMEN: Soft, non-tender, non-distended EXTREMITIES:  No edema; No deformity   ASSESSMENT AND PLAN: .  Dysautonomia Sinus tachycardia Symptoms sound stable No syncope Revisited management strategies  3. CP, SOB ~ 2weeks post COVID booster Will get an echo No acute changes on the EKG       Dispo: they are comfortable with annual visits, encouraged sooner if needed  Signed, Debbie Fails, PA-C

## 2024-02-23 ENCOUNTER — Ambulatory Visit: Attending: Physician Assistant | Admitting: Physician Assistant

## 2024-02-23 ENCOUNTER — Encounter: Payer: Self-pay | Admitting: Physician Assistant

## 2024-02-23 VITALS — BP 98/62 | HR 94 | Ht 66.0 in | Wt 201.2 lb

## 2024-02-23 DIAGNOSIS — R079 Chest pain, unspecified: Secondary | ICD-10-CM | POA: Insufficient documentation

## 2024-02-23 DIAGNOSIS — G901 Familial dysautonomia [Riley-Day]: Secondary | ICD-10-CM | POA: Diagnosis present

## 2024-02-23 DIAGNOSIS — R000 Tachycardia, unspecified: Secondary | ICD-10-CM | POA: Insufficient documentation

## 2024-02-23 DIAGNOSIS — R0602 Shortness of breath: Secondary | ICD-10-CM | POA: Insufficient documentation

## 2024-02-23 MED ORDER — METOPROLOL SUCCINATE ER 50 MG PO TB24: 50.0000 mg | ORAL_TABLET | Freq: Every day | ORAL | 3 refills | Status: AC

## 2024-02-23 NOTE — Patient Instructions (Addendum)
 Medication Instructions:   Your physician recommends that you continue on your current medications as directed. Please refer to the Current Medication list given to you today.   *If you need a refill on your cardiac medications before your next appointment, please call your pharmacy*  Lab Work: NONE ORDERED  TODAY    If you have labs (blood work) drawn today and your tests are completely normal, you will receive your results only by: MyChart Message (if you have MyChart) OR A paper copy in the mail If you have any lab test that is abnormal or we need to change your treatment, we will call you to review the results.  Testing/Procedures: Your physician has requested that you have an echocardiogram. Echocardiography is a painless test that uses sound waves to create images of your heart. It provides your doctor with information about the size and shape of your heart and how well your heart's chambers and valves are working. This procedure takes approximately one hour. There are no restrictions for this procedure. Please do NOT wear cologne, perfume, aftershave, or lotions (deodorant is allowed). Please arrive 15 minutes prior to your appointment time.  Please note: We ask at that you not bring children with you during ultrasound (echo/ vascular) testing. Due to room size and safety concerns, children are not allowed in the ultrasound rooms during exams. Our front office staff cannot provide observation of children in our lobby area while testing is being conducted. An adult accompanying a patient to their appointment will only be allowed in the ultrasound room at the discretion of the ultrasound technician under special circumstances. We apologize for any inconvenience.    Follow-Up: At [facility], you and your health needs are our priority.  As part of our continuing mission to provide you with exceptional heart care, our providers are all part of one team.  This team includes your  primary Cardiologist (physician) and Advanced Practice Providers or APPs (Physician Assistants and Nurse Practitioners) who all work together to provide you with the care you need, when you need it.  Your next appointment:   1 year(s)  Provider:   Harvie Liner, MD or Mertha Abrahams, PA-C    We recommend signing up for the patient portal called "MyChart".  Sign up information is provided on this After Visit Summary.  MyChart is used to connect with patients for Virtual Visits (Telemedicine).  Patients are able to view lab/test results, encounter notes, upcoming appointments, etc.  Non-urgent messages can be sent to your provider as well.   To learn more about what you can do with MyChart, go to [email or web address].   Other Instructions

## 2024-04-03 ENCOUNTER — Ambulatory Visit (HOSPITAL_COMMUNITY)

## 2024-05-10 ENCOUNTER — Encounter (HOSPITAL_COMMUNITY): Payer: Self-pay | Admitting: Physician Assistant

## 2024-05-10 ENCOUNTER — Ambulatory Visit (HOSPITAL_COMMUNITY)
Admission: RE | Admit: 2024-05-10 | Source: Ambulatory Visit | Attending: Physician Assistant | Admitting: Physician Assistant

## 2024-06-22 ENCOUNTER — Encounter: Payer: Self-pay | Admitting: Physician Assistant
